# Patient Record
Sex: Female | Born: 1985 | Race: Black or African American | Hispanic: No | Marital: Single | Smoking: Never smoker
Health system: Southern US, Community
[De-identification: ages and names within clinical notes are randomized; demographics above are authoritative.]

## PROBLEM LIST (undated history)

## (undated) ENCOUNTER — Inpatient Hospital Stay (HOSPITAL_COMMUNITY): Payer: Self-pay

## (undated) DIAGNOSIS — R519 Headache, unspecified: Secondary | ICD-10-CM

## (undated) DIAGNOSIS — R51 Headache: Secondary | ICD-10-CM

## (undated) HISTORY — PX: WISDOM TOOTH EXTRACTION: SHX21

## (undated) HISTORY — PX: HERNIA REPAIR: SHX51

---

## 2014-06-07 ENCOUNTER — Inpatient Hospital Stay (HOSPITAL_COMMUNITY)
Admission: AD | Admit: 2014-06-07 | Discharge: 2014-06-07 | Discharge status: Against Medical Advice | Payer: Self-pay | Source: Ambulatory Visit | Attending: Obstetrics and Gynecology | Admitting: Obstetrics and Gynecology

## 2014-06-07 NOTE — MAU Note (Signed)
Pt not in lobby, third call 

## 2014-06-07 NOTE — MAU Note (Addendum)
Pt called, not in lobby 

## 2014-06-07 NOTE — MAU Note (Signed)
Pt not in lobby.  

## 2014-06-11 ENCOUNTER — Inpatient Hospital Stay (HOSPITAL_COMMUNITY)
Admission: AD | Admit: 2014-06-11 | Discharge: 2014-06-11 | Disposition: A | Discharge status: Home / Self Care | Payer: Medicaid Other | Source: Ambulatory Visit | Attending: Obstetrics and Gynecology | Admitting: Obstetrics and Gynecology

## 2014-06-11 ENCOUNTER — Encounter (HOSPITAL_COMMUNITY): Payer: Self-pay | Admitting: *Deleted

## 2014-06-11 ENCOUNTER — Inpatient Hospital Stay (HOSPITAL_COMMUNITY): Payer: Medicaid Other

## 2014-06-11 DIAGNOSIS — R52 Pain, unspecified: Secondary | ICD-10-CM

## 2014-06-11 DIAGNOSIS — O26899 Other specified pregnancy related conditions, unspecified trimester: Secondary | ICD-10-CM

## 2014-06-11 DIAGNOSIS — O26891 Other specified pregnancy related conditions, first trimester: Secondary | ICD-10-CM | POA: Diagnosis not present

## 2014-06-11 DIAGNOSIS — R109 Unspecified abdominal pain: Secondary | ICD-10-CM

## 2014-06-11 DIAGNOSIS — R58 Hemorrhage, not elsewhere classified: Secondary | ICD-10-CM

## 2014-06-11 DIAGNOSIS — R102 Pelvic and perineal pain: Secondary | ICD-10-CM | POA: Diagnosis not present

## 2014-06-11 DIAGNOSIS — Z3A09 9 weeks gestation of pregnancy: Secondary | ICD-10-CM | POA: Diagnosis not present

## 2014-06-11 DIAGNOSIS — Z3491 Encounter for supervision of normal pregnancy, unspecified, first trimester: Secondary | ICD-10-CM

## 2014-06-11 HISTORY — DX: Headache, unspecified: R51.9

## 2014-06-11 HISTORY — DX: Headache: R51

## 2014-06-11 LAB — HCG, QUANTITATIVE, PREGNANCY: HCG, BETA CHAIN, QUANT, S: 26125 m[IU]/mL — AB (ref <5)

## 2014-06-11 LAB — CBC
HCT: 37.8 % (ref 36.0–46.0)
Hemoglobin: 13.1 g/dL (ref 12.0–15.0)
MCH: 30.3 pg (ref 26.0–34.0)
MCHC: 34.7 g/dL (ref 30.0–36.0)
MCV: 87.5 fL (ref 78.0–100.0)
PLATELETS: 129 10*3/uL — AB (ref 150–400)
RBC: 4.32 MIL/uL (ref 3.87–5.11)
RDW: 12.5 % (ref 11.5–15.5)
WBC: 3.9 10*3/uL — AB (ref 4.0–10.5)

## 2014-06-11 LAB — URINALYSIS, ROUTINE W REFLEX MICROSCOPIC
BILIRUBIN URINE: NEGATIVE
Glucose, UA: NEGATIVE mg/dL
Hgb urine dipstick: NEGATIVE
Ketones, ur: NEGATIVE mg/dL
Leukocytes, UA: NEGATIVE
Nitrite: NEGATIVE
PROTEIN: NEGATIVE mg/dL
Specific Gravity, Urine: 1.02 (ref 1.005–1.030)
UROBILINOGEN UA: 0.2 mg/dL (ref 0.0–1.0)
pH: 7 (ref 5.0–8.0)

## 2014-06-11 LAB — WET PREP, GENITAL
CLUE CELLS WET PREP: NONE SEEN
Trich, Wet Prep: NONE SEEN
Yeast Wet Prep HPF POC: NONE SEEN

## 2014-06-11 LAB — ABO/RH: ABO/RH(D): B POS

## 2014-06-11 LAB — POCT PREGNANCY, URINE: Preg Test, Ur: POSITIVE — AB

## 2014-06-11 LAB — HIV ANTIBODY (ROUTINE TESTING W REFLEX): HIV: NONREACTIVE

## 2014-06-11 NOTE — MAU Note (Addendum)
Donating plasma for past month. Wants to make sure everything is ok. Wants U/S to see how far along she is. [facility].

## 2014-06-11 NOTE — MAU Provider Note (Signed)
History     CSN: [id number]  Arrival date and time: 06/11/14 0730   First Provider Initiated Contact with Patient 06/11/14 [phone]      Chief Complaint  Patient presents with  . Possible Pregnancy   HPI Comments: Chloe Armstrong 28 y.o. G2P1001 [redacted]w[redacted]d presents to MAU with two separate episodes of pain and one episode of bleeding. Her first episode of pain was 3 days in Sept 18-20th and she experienced bleeding to the point she thought she was having a miscarriage. Then this month on Oct 17 th and 18th she experienced severe pain again and it was so intense she was crying and would call the level"10". She is having no pain or bleeding now. She is long term patient of Dr Henderson Cloud.   Possible Pregnancy      Past Medical History  Diagnosis Date  . Headache     Chronic migraines    Past Surgical History  Procedure Laterality Date  . Wisdom tooth extraction      History reviewed. No pertinent family history.  History  Substance Use Topics  . Smoking status: Never Smoker   . Smokeless tobacco: Never Used  . Alcohol Use: No    Allergies: No Known Allergies  Prescriptions prior to admission  Medication Sig Dispense Refill  . Prenatal Vit-Fe Fumarate-FA (PRENATAL MULTIVITAMIN) TABS tablet Take 1 tablet by mouth daily at 12 noon.        Review of Systems  Constitutional: Negative.   HENT: Negative.   Eyes: Negative.   Respiratory: Negative.   Cardiovascular: Negative.   Gastrointestinal: Negative.   Genitourinary: Negative.   Musculoskeletal: Negative.   Skin: Negative.   Neurological: Negative.   Endo/Heme/Allergies: Negative.   Psychiatric/Behavioral: Negative.    Physical Exam   Blood pressure 119/73, pulse 93, temperature 98.6 F (37 C), temperature source Oral, resp. rate 18, height 5\' 7"  (1.702 m), weight 100.88 kg (222 lb 6.4 oz), last menstrual period 04/06/2014.  Physical Exam  Constitutional: She is oriented to person, place, and time. She appears  well-developed and well-nourished. No distress.  HENT:  Head: Normocephalic.  GI: Soft. Bowel sounds are normal. She exhibits no distension. There is no tenderness. There is no rebound.  Genitourinary:  Genital:external negative Vaginal:small amount white discharge Cervix:closed/ thick Bimanual:nontender   Musculoskeletal: Normal range of motion.  Neurological: She is alert and oriented to person, place, and time.  Skin: Skin is warm and dry.  Psychiatric: She has a normal mood and affect. Her behavior is normal. Judgment and thought content normal.   Results for orders placed during the hospital encounter of 06/11/14 (from the past 24 hour(s))  URINALYSIS, ROUTINE W REFLEX MICROSCOPIC     Status: None   Collection Time    06/11/14  7:30 AM      Result Value Ref Range   Color, Urine YELLOW  YELLOW   APPearance CLEAR  CLEAR   Specific Gravity, Urine 1.020  1.005 - 1.030   pH 7.0  5.0 - 8.0   Glucose, UA NEGATIVE  NEGATIVE mg/dL   Hgb urine dipstick NEGATIVE  NEGATIVE   Bilirubin Urine NEGATIVE  NEGATIVE   Ketones, ur NEGATIVE  NEGATIVE mg/dL   Protein, ur NEGATIVE  NEGATIVE mg/dL   Urobilinogen, UA 0.2  0.0 - 1.0 mg/dL   Nitrite NEGATIVE  NEGATIVE   Leukocytes, UA NEGATIVE  NEGATIVE  POCT PREGNANCY, URINE     Status: Abnormal   Collection Time    06/11/14  7:46  AM      Result Value Ref Range   Preg Test, Ur POSITIVE (*) NEGATIVE  WET PREP, GENITAL     Status: Abnormal   Collection Time    06/11/14  8:23 AM      Result Value Ref Range   Yeast Wet Prep HPF POC NONE SEEN  NONE SEEN   Trich, Wet Prep NONE SEEN  NONE SEEN   Clue Cells Wet Prep HPF POC NONE SEEN  NONE SEEN   WBC, Wet Prep HPF POC FEW (*) NONE SEEN  HCG, QUANTITATIVE, PREGNANCY     Status: Abnormal   Collection Time    06/11/14  8:58 AM      Result Value Ref Range   hCG, Beta ChainMahalia Armstrong, [place] [address] (*) <5 mIU/mL  CBC     Status: Abnormal   Collection Time    06/11/14  8:59 AM      Result Value Ref Range    WBC 3.9 (*) 4.0 - 10.5 K/uL   RBC 4.32  3.87 - 5.11 MIL/uL   Hemoglobin 13.1  12.0 - 15.0 g/dL   HCT 16.137.8  09.636.0 - 04.546.0 %   MCV 87.5  78.0 - 100.0 fL   MCH 30.3  26.0 - 34.0 pg   MCHC 34.7  30.0 - 36.0 g/dL   RDW 40.912.5  81.111.5 - 91.415.5 %   Platelets 129 (*) 150 - 400 K/uL  ABO/RH     Status: None   Collection Time    06/11/14  8:59 AM      Result Value Ref Range   ABO/RH(D) B POS     [place] Ob Comp Less 14 Wks  06/11/2014   CLINICAL DATA:  28 year old female with acute pain and light bleeding in the first trimester of pregnancy. Initial encounter. Quantitative beta HCG pending. Estimated gestational age by LMP 9 weeks 3 days.  EXAM: OBSTETRIC <14 WK US AND TRANSVAGINAL OB US  TECHNIQUE: Both transabdominal and transvaginal ultrasound examinations were performed for complete evaluation of the gestation as well as the maternal uterus, adnexal regions, and pelvic cul-de-sac. Transvaginal technique was performed to assess early pregnancy.  COMPARISON:  None.  FINDINGS: Intrauterine gestational sac: Single  Yolk sac:  Visible  Embryo:  Visible  Cardiac Activity: Detected  Heart Rate:  115 bpm  CRL:   4.5  mm   6 w 2 d  Maternal uterus/adnexae: Small volume subchorionic hemorrhage (image 17). Probable corpus luteum cyst of the right ovary which measures 3.6 x 2.0 x 2.9 cm. Left ovary not visualized. Small volume simple appearing free fluid about the right ovary.  IMPRESSION: 1.  Single living IUP demonstrated. 2. Small volume subchorionic hemorrhage and simple appearing pelvic free fluid.   Electronically Signed   By: Augusto GambleLee  Armstrong M.D.   On: 06/11/2014 09:55   [place] Ob Transvaginal  06/11/2014   CLINICAL DATA:  28 year old female with acute pain and light bleeding in the first trimester of pregnancy. Initial encounter. Quantitative beta HCG pending. Estimated gestational age by LMP 9 weeks 3 days.  EXAM: OBSTETRIC <14 WK US AND TRANSVAGINAL OB US  TECHNIQUE: Both transabdominal and transvaginal ultrasound  examinations were performed for complete evaluation of the gestation as well as the maternal uterus, adnexal regions, and pelvic cul-de-sac. Transvaginal technique was performed to assess early pregnancy.  COMPARISON:  None.  FINDINGS: Intrauterine gestational sac: Single  Yolk sac:  Visible  Embryo:  Visible  Cardiac Activity: Detected  Heart Rate:  115  bpm  CRL:   4.5  mm   6 w 2 d  Maternal uterus/adnexae: Small volume subchorionic hemorrhage (image 17). Probable corpus luteum cyst of the right ovary which measures 3.6 x 2.0 x 2.9 cm. Left ovary not visualized. Small volume simple appearing free fluid about the right ovary.  IMPRESSION: 1.  Single living IUP demonstrated. 2. Small volume subchorionic hemorrhage and simple appearing pelvic free fluid.   Electronically Signed   By: Augusto GambleLee  Armstrong M.D.   On: 06/11/2014 09:55     MAU Course  Procedures  MDM Wet prep, GC, Chlamydia, CBC, UA, U/S, ABORh, Quant, HIV Reviewed all results with patient Discussed with Dr Tenny Crawoss  Assessment and Plan   A: Pelvic pain on pregnancy IUP  P: Reviewed all results with patients Advised to start PNV and good nutrition Advised to establish prenatal care  Return to MAU as needed    Carolynn ServeBarefoot, Bennie Scaff Miller 06/11/2014, 8:37 AM

## 2014-06-11 NOTE — Discharge Instructions (Signed)

## 2014-06-12 LAB — GC/CHLAMYDIA PROBE AMP
CT Probe RNA: NEGATIVE
GC Probe RNA: NEGATIVE

## 2014-06-15 ENCOUNTER — Encounter (HOSPITAL_COMMUNITY): Payer: Self-pay | Admitting: *Deleted

## 2014-07-21 ENCOUNTER — Other Ambulatory Visit: Payer: Self-pay | Admitting: Obstetrics and Gynecology

## 2014-07-21 LAB — OB RESULTS CONSOLE RUBELLA ANTIBODY, IGM: RUBELLA: IMMUNE

## 2014-07-21 LAB — OB RESULTS CONSOLE RPR: RPR: NONREACTIVE

## 2014-07-21 LAB — OB RESULTS CONSOLE ABO/RH: RH Type: POSITIVE

## 2014-07-21 LAB — OB RESULTS CONSOLE ANTIBODY SCREEN: Antibody Screen: NEGATIVE

## 2014-07-21 LAB — OB RESULTS CONSOLE HIV ANTIBODY (ROUTINE TESTING): HIV: NONREACTIVE

## 2014-07-21 LAB — OB RESULTS CONSOLE GC/CHLAMYDIA
Chlamydia: NEGATIVE
Gonorrhea: NEGATIVE

## 2014-07-21 LAB — OB RESULTS CONSOLE HEPATITIS B SURFACE ANTIGEN: Hepatitis B Surface Ag: NEGATIVE

## 2014-07-22 LAB — CYTOLOGY - PAP

## 2014-08-13 NOTE — L&D Delivery Note (Signed)
Patient was C/C/+4 and pushed for 1 minutes with epidural.   NSVD  female infant, Apgars 8,9, weight P.   The patient had no lacerations. Fundus was firm. EBL was expected amount my estimate <50cc.   Placenta was delivered intact. Vagina was clear.  Baby was vigorous and doing skin to skin with mother.  Undecided about where she wants to have circ done.  Chloe Armstrong

## 2014-08-17 ENCOUNTER — Encounter (HOSPITAL_COMMUNITY): Payer: Self-pay | Admitting: *Deleted

## 2014-08-17 ENCOUNTER — Inpatient Hospital Stay (HOSPITAL_COMMUNITY)
Admission: AD | Admit: 2014-08-17 | Discharge: 2014-08-17 | Disposition: A | Discharge status: Home / Self Care | Payer: Medicaid Other | Source: Ambulatory Visit | Attending: Obstetrics and Gynecology | Admitting: Obstetrics and Gynecology

## 2014-08-17 DIAGNOSIS — R519 Headache, unspecified: Secondary | ICD-10-CM

## 2014-08-17 DIAGNOSIS — O26899 Other specified pregnancy related conditions, unspecified trimester: Secondary | ICD-10-CM

## 2014-08-17 DIAGNOSIS — G43709 Chronic migraine without aura, not intractable, without status migrainosus: Secondary | ICD-10-CM

## 2014-08-17 DIAGNOSIS — M549 Dorsalgia, unspecified: Secondary | ICD-10-CM

## 2014-08-17 DIAGNOSIS — O210 Mild hyperemesis gravidarum: Secondary | ICD-10-CM | POA: Insufficient documentation

## 2014-08-17 DIAGNOSIS — M545 Low back pain: Secondary | ICD-10-CM | POA: Insufficient documentation

## 2014-08-17 DIAGNOSIS — G43909 Migraine, unspecified, not intractable, without status migrainosus: Secondary | ICD-10-CM | POA: Insufficient documentation

## 2014-08-17 DIAGNOSIS — O9989 Other specified diseases and conditions complicating pregnancy, childbirth and the puerperium: Secondary | ICD-10-CM | POA: Diagnosis not present

## 2014-08-17 DIAGNOSIS — Z3A15 15 weeks gestation of pregnancy: Secondary | ICD-10-CM | POA: Diagnosis not present

## 2014-08-17 DIAGNOSIS — O26892 Other specified pregnancy related conditions, second trimester: Secondary | ICD-10-CM

## 2014-08-17 DIAGNOSIS — R51 Headache: Secondary | ICD-10-CM

## 2014-08-17 DIAGNOSIS — O219 Vomiting of pregnancy, unspecified: Secondary | ICD-10-CM

## 2014-08-17 LAB — URINALYSIS, ROUTINE W REFLEX MICROSCOPIC
Bilirubin Urine: NEGATIVE
Glucose, UA: NEGATIVE mg/dL
HGB URINE DIPSTICK: NEGATIVE
KETONES UR: NEGATIVE mg/dL
Leukocytes, UA: NEGATIVE
Nitrite: NEGATIVE
PROTEIN: NEGATIVE mg/dL
Specific Gravity, Urine: 1.015 (ref 1.005–1.030)
Urobilinogen, UA: 0.2 mg/dL (ref 0.0–1.0)
pH: 8.5 — ABNORMAL HIGH (ref 5.0–8.0)

## 2014-08-17 MED ORDER — IBUPROFEN 600 MG PO TABS: 600.0000 mg | ORAL_TABLET | Freq: Four times a day (QID) | ORAL | Status: DC | PRN

## 2014-08-17 MED ORDER — ONDANSETRON HCL 4 MG PO TABS
8.0000 mg | ORAL_TABLET | Freq: Once | ORAL | Status: AC
  Administered 2014-08-17: 8 mg via ORAL
  Filled 2014-08-17: qty 2

## 2014-08-17 MED ORDER — CYCLOBENZAPRINE HCL 10 MG PO TABS: 5.0000 mg | ORAL_TABLET | Freq: Three times a day (TID) | ORAL | Status: DC | PRN

## 2014-08-17 MED ORDER — IBUPROFEN 600 MG PO TABS
600.0000 mg | ORAL_TABLET | Freq: Once | ORAL | Status: AC
  Administered 2014-08-17: 600 mg via ORAL
  Filled 2014-08-17: qty 1

## 2014-08-17 MED ORDER — ONDANSETRON HCL 4 MG PO TABS: 4.0000 mg | ORAL_TABLET | Freq: Every day | ORAL | Status: DC | PRN

## 2014-08-17 MED ORDER — ACETAMINOPHEN 325 MG PO TABS
650.0000 mg | ORAL_TABLET | Freq: Once | ORAL | Status: AC
  Administered 2014-08-17: 650 mg via ORAL
  Filled 2014-08-17: qty 2

## 2014-08-17 NOTE — MAU Note (Signed)
Pt reports lower back pain, lower abd pain off/on for the last week. Also reports a headache today and pain in the back of her right eye.dysuria.

## 2014-08-17 NOTE — MAU Provider Note (Signed)
Chief Complaint: Back Pain; Abdominal Pain; and Headache   First Provider Initiated Contact with Patient 08/17/14 0803     SUBJECTIVE HPI: Chloe Armstrong is a 29 y.o. G2P1001 at 3618w6d by LMP who presents with bil lower back pain 8/10 onset 06/2014. 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 Pt reports pain is dull ache radiating up back. Reports mechanical trip and fall 3 weeks ago at work, denies leg numbness/tingling to incontinence. Last BM today, was "loose but normal" per pt report.Angelique Blonder. Denise home pain meds, denies cp, sob, cough.  Pt also reports Rt side post auricular headache onset 3am today. Pt reports hx of migraines, denies taking medications for it. Pt reports "a little blurry" in rt eye. Pt reports drove without difficulty to MAU today. Pt reports decreased PO fluid and food intake x 1 week due to intermittent nausea and irregular work hours. Pt reports 1 episode of vomiting bile this am, reports able to tolerate juice just prior to arrival. Pt denies nausea at this time.  Pt denies vaginal bleeding or discharge, dysuria, hematuria, fever/chills, cough. Pt denies abd pain at this time. [facility] for routine OB visit.    Past Medical History  Diagnosis Date  . Headache     Chronic migraines   OB History  Gravida Para Term Preterm AB SAB TAB Ectopic Multiple Living  2 1 1       1     # Outcome Date GA Lbr Len/2nd Weight Sex Delivery Anes PTL Lv  2 Current           1 Term      Vag-Spont        Past Surgical History  Procedure Laterality Date  . Wisdom tooth extraction     History   Social History  . Marital Status: Single    Spouse Name: N/A    Number of Children: N/A  . Years of Education: N/A   Occupational History  . Not on file.   Social History Main Topics  . Smoking status: Never Smoker   . Smokeless tobacco: Never Used  . Alcohol Use: No  . Drug Use: No  . Sexual  Activity: Not on file   Other Topics Concern  . Not on file   Social History Narrative   No current facility-administered medications on file prior to encounter.   Current Outpatient Prescriptions on File Prior to Encounter  Medication Sig Dispense Refill  . Prenatal Vit-Fe Fumarate-FA (PRENATAL MULTIVITAMIN) TABS tablet Take 1 tablet by mouth daily at 12 noon.     No Known Allergies  ROS: Pertinent items in HPI  OBJECTIVE Blood pressure 129/72, pulse 87, temperature 99 F (37.2 C), temperature source Oral, resp. rate 16, height 5\' 6"  (1.676 m), weight 105.235 kg (232 lb), last menstrual period 04/06/2014, SpO2 100 %. GENERAL: Well-developed, well-nourished female in no acute distress.  HEENT: Normocephalic HEART: normal rate, S1S2 auscultated RESP: normal effort, bil lung fields clear ABDOMEN: Soft, non-tender, + bowel sounds in all quadrants, no masses or organomegal. EXTREMITIES: Nontender, no edema.  NEURO: Alert and oriented Musculoskeletal: Bil lumbar accessory muscle tightness and pain with palpation. Full spinal ROM flexion, extension and rotation. Distal sensation and circulation intact. FHR 145 by doppler.   LAB RESULTS Results for orders placed or performed during the hospital encounter of 08/17/14 (from the past 24 hour(s))  Urinalysis, Routine w reflex microscopic  Status: Abnormal   Collection Time: 08/17/14  6:45 AM  Result Value Ref Range   Color, Urine YELLOW YELLOW   APPearance CLEAR CLEAR   Specific Gravity, Urine 1.015 1.005 - 1.030   pH 8.5 (H) 5.0 - 8.0   Glucose, UA NEGATIVE NEGATIVE mg/dL   Hgb urine dipstick NEGATIVE NEGATIVE   Bilirubin Urine NEGATIVE NEGATIVE   Ketones, ur NEGATIVE NEGATIVE mg/dL   Protein, ur NEGATIVE NEGATIVE mg/dL   Urobilinogen, UA 0.2 0.0 - 1.0 mg/dL   Nitrite NEGATIVE NEGATIVE   Leukocytes, UA NEGATIVE NEGATIVE    IMAGING No results found.  MAU COURSE Ibuprofen and Zofran for pain and Nausea. Pt reports pain  decreased to 4/10, no difficulty tolerating PO intake. ASSESSMENT 1. Back pain during pregnancy   2. Headache in pregnancy, second trimester   3. Nausea and vomiting during pregnancy prior to [redacted] weeks gestation   4. Chronic migraine without aura without status migrainosus, not intractable    PLAN Discharge home Rx Flexeril 5-10mg  PO Q8H PRN pain, pt educated on side effects, advised not to drive with this medication. Zofran  PO Q8H PRN nausea OTC Ibuprofen PRN HA and back pain, pt educated not to use after 28 weeks. F/U with [facility] at regularly scheduled appt Pt educated on dietary recommendations and eating pattern during pregnancy Work note given.   Follow-up Information    Follow up with Mickel Baas, MD.   Specialty:  Obstetrics and Gynecology   Why:  As Scheduled, or as needed, If symptoms worsen   Contact information:   [address] [place] [address] [phone]        Medication List    TAKE these medications        cyclobenzaprine 10 MG tablet  Commonly known as:  FLEXERIL  Take 0.5-1 tablets (5-10 mg total) by mouth every 8 (eight) hours as needed for muscle spasms.     ibuprofen 600 MG tablet  Commonly known as:  ADVIL,MOTRIN  Take 1 tablet (600 mg total) by mouth every 6 (six) hours as needed for headache or moderate pain. Do not use after [redacted] weeks gestation.     ondansetron 4 MG tablet  Commonly known as:  ZOFRAN  Take 1 tablet (4 mg total) by mouth daily as needed for nausea or vomiting.     prenatal multivitamin Tabs tablet  Take 1 tablet by mouth daily at 12 noon.       Jsean Taussig, FNP-S I was present for the exam and agree with above.  [place], CNM 08/17/2014 9:43 AM

## 2014-08-17 NOTE — MAU Note (Addendum)
PT SAYS SHE STARTED HAVING  ABD PAIN  ON  SAT - BUT  STOPPED - STARTED AGAIN  LAST NIGHT . NOW HURTS LESS.     SAYS BACK PAIN STARTED IN  NOV- HAS CONTINUED   2-3 X /WEEK-   HURTS WORSE  WHEN IN BED AT NIGHT.     THIS MORN AT 0300- SHE WOKE  WITH H/A  AND EYES HURTING -   NO MEDS.   HEAD HURTS LESS NOW.      SAYS HAD LOOSE BM   THIS AM.    VOMITED  AT 0400   -STILL VOMITS EVERYDAY - NO PHENERGAN.   WAS IN OFFICE  ON 12-31.     NEXT APPOINTMENT   IS 09-09-2014.     LAST SEX-   SUNDAY

## 2014-08-17 NOTE — Discharge Instructions (Signed)

## 2014-10-19 ENCOUNTER — Inpatient Hospital Stay (HOSPITAL_COMMUNITY)
Admission: AD | Admit: 2014-10-19 | Discharge: 2014-10-19 | Disposition: A | Discharge status: Home / Self Care | Payer: Medicaid Other | Source: Ambulatory Visit | Attending: Obstetrics and Gynecology | Admitting: Obstetrics and Gynecology

## 2014-10-19 ENCOUNTER — Encounter (HOSPITAL_COMMUNITY): Payer: Self-pay | Admitting: *Deleted

## 2014-10-19 DIAGNOSIS — N949 Unspecified condition associated with female genital organs and menstrual cycle: Secondary | ICD-10-CM

## 2014-10-19 DIAGNOSIS — O9989 Other specified diseases and conditions complicating pregnancy, childbirth and the puerperium: Secondary | ICD-10-CM | POA: Diagnosis not present

## 2014-10-19 DIAGNOSIS — Z3A24 24 weeks gestation of pregnancy: Secondary | ICD-10-CM

## 2014-10-19 DIAGNOSIS — Z3A25 25 weeks gestation of pregnancy: Secondary | ICD-10-CM | POA: Insufficient documentation

## 2014-10-19 DIAGNOSIS — R109 Unspecified abdominal pain: Secondary | ICD-10-CM | POA: Diagnosis present

## 2014-10-19 LAB — URINALYSIS, ROUTINE W REFLEX MICROSCOPIC
Bilirubin Urine: NEGATIVE
Glucose, UA: NEGATIVE mg/dL
HGB URINE DIPSTICK: NEGATIVE
Ketones, ur: NEGATIVE mg/dL
Leukocytes, UA: NEGATIVE
NITRITE: NEGATIVE
Protein, ur: NEGATIVE mg/dL
SPECIFIC GRAVITY, URINE: 1.025 (ref 1.005–1.030)
Urobilinogen, UA: 0.2 mg/dL (ref 0.0–1.0)
pH: 6.5 (ref 5.0–8.0)

## 2014-10-19 NOTE — MAU Note (Signed)
Lower abd/pelvic pain since Sunday, unable to sleep last night.  Vomited this morning, no diarrhea.  Denies bleeding or LOF.

## 2014-10-19 NOTE — MAU Provider Note (Signed)
History     CSN: [id number]  Arrival date and time: 10/19/14 1710   None     Chief Complaint  Patient presents with  . Pelvic Pain   HPI This is a 29 y.o. female at 2444w6d who presents with c/o pelvic pain since Sunday. Pain gets better when she lays down. Has some back pain. Feels it most when she turns side to side in bed. Had one episode of vomiting this morning. No diarrhea or bleeding.  States primary is Dr Henderson CloudHorvath  RN note:  Expand All Collapse All   Pt states she is having pain in pelvic area that started on Sunday. Pt states pain goes away when she lays down and back start hurting. Pt states she feels pain the most when she lays on either side       Lower abd/pelvic pain since Sunday, unable to sleep last night. Vomited this morning, no diarrhea. Denies bleeding or LOF.           OB History    Gravida Para Term Preterm AB TAB SAB Ectopic Multiple Living   2 1 1       1       Past Medical History  Diagnosis Date  . Headache     Chronic migraines    Past Surgical History  Procedure Laterality Date  . Wisdom tooth extraction      History reviewed. No pertinent family history.  History  Substance Use Topics  . Smoking status: Never Smoker   . Smokeless tobacco: Never Used  . Alcohol Use: No    Allergies: No Known Allergies  Prescriptions prior to admission  Medication Sig Dispense Refill Last Dose  . Prenatal Vit-Fe Fumarate-FA (PRENATAL MULTIVITAMIN) TABS tablet Take 1 tablet by mouth daily at 12 noon.   Past Week at Unknown time  . cyclobenzaprine (FLEXERIL) 10 MG tablet Take 0.5-1 tablets (5-10 mg total) by mouth every 8 (eight) hours as needed for muscle spasms. (Patient not taking: Reported on 10/19/2014) 30 tablet 0 Not Taking at Unknown time  . ibuprofen (ADVIL,MOTRIN) 600 MG tablet Take 1 tablet (600 mg total) by mouth every 6 (six) hours as needed for headache or moderate pain. Do not use after [redacted] weeks gestation. (Patient not taking:  Reported on 10/19/2014) 30 tablet 1 Not Taking at Unknown time  . ondansetron (ZOFRAN) 4 MG tablet Take 1 tablet (4 mg total) by mouth daily as needed for nausea or vomiting. (Patient not taking: Reported on 10/19/2014) 30 tablet 1 Not Taking at Unknown time    Review of Systems  Constitutional: Negative for fever, chills and malaise/fatigue.  Gastrointestinal: Positive for vomiting and abdominal pain. Negative for nausea, diarrhea and constipation.  Genitourinary: Negative for dysuria.  Neurological: Negative for dizziness.   Physical Exam   Blood pressure 132/83, pulse 77, temperature 98.3 F (36.8 C), temperature source Oral, resp. rate 20, last menstrual period 04/06/2014.  Physical Exam  Constitutional: She is oriented to person, place, and time. She appears well-developed and well-nourished. No distress.  HENT:  Head: Normocephalic.  Cardiovascular: Normal rate and regular rhythm.  Exam reveals no gallop and no friction rub.   No murmur heard. Respiratory: Effort normal and breath sounds normal.  GI: Soft. She exhibits no distension and no mass. There is tenderness (over round ligaments). There is no rebound and no guarding.  Genitourinary: Vagina normal. No vaginal discharge found.  Dilation: Closed Effacement (%): Thick   Musculoskeletal: Normal range of motion.  Neurological: She  is alert and oriented to person, place, and time.  Skin: Skin is warm and dry.  Psychiatric: She has a normal mood and affect.   FHR reassuring No overt contractions. There is one small area for a few minutes where there looks like irritability cramps  MAU Course  Procedures  MDM Results for orders placed or performed during the hospital encounter of 10/19/14 (from the past 24 hour(s))  Urinalysis, Routine w reflex microscopic     Status: Abnormal   Collection Time: 10/19/14  5:30 PM  Result Value Ref Range   Color, Urine YELLOW YELLOW   APPearance HAZY (A) CLEAR   Specific Gravity, Urine  1.025 1.005 - 1.030   pH 6.5 5.0 - 8.0   Glucose, UA NEGATIVE NEGATIVE mg/dL   Hgb urine dipstick NEGATIVE NEGATIVE   Bilirubin Urine NEGATIVE NEGATIVE   Ketones, ur NEGATIVE NEGATIVE mg/dL   Protein, ur NEGATIVE NEGATIVE mg/dL   Urobilinogen, UA 0.2 0.0 - 1.0 mg/dL   Nitrite NEGATIVE NEGATIVE   Leukocytes, UA NEGATIVE NEGATIVE   FFn collected but not sent Discussed with Dr Claiborne Billings.   Assessment and Plan  A:  SIUP at [redacted]w[redacted]d       Lower abdominal pain, most probably related to round ligament pain      No evidence of preterm labor or cervical change     Normal UA  P:  Discharge home       Discussed comfort measures for ligament pain       PTL precautions reviewed       Followup in office as scheduled  [facility] 10/19/2014, 5:49 PM

## 2014-10-19 NOTE — Discharge Instructions (Signed)

## 2014-10-19 NOTE — MAU Note (Signed)
Pt states she is having pain in pelvic area that started on Sunday. Pt states pain goes away when she lays down and back start hurting. Pt states she feels pain the most when she lays on either side

## 2014-12-18 ENCOUNTER — Encounter (HOSPITAL_COMMUNITY): Payer: Self-pay | Admitting: *Deleted

## 2014-12-18 ENCOUNTER — Emergency Department (HOSPITAL_COMMUNITY)
Admission: EM | Admit: 2014-12-18 | Discharge: 2014-12-18 | Disposition: A | Discharge status: Home / Self Care | Payer: Medicaid Other | Attending: Emergency Medicine | Admitting: Emergency Medicine

## 2014-12-18 DIAGNOSIS — B373 Candidiasis of vulva and vagina: Secondary | ICD-10-CM | POA: Diagnosis not present

## 2014-12-18 DIAGNOSIS — Z8679 Personal history of other diseases of the circulatory system: Secondary | ICD-10-CM | POA: Diagnosis not present

## 2014-12-18 DIAGNOSIS — B3731 Acute candidiasis of vulva and vagina: Secondary | ICD-10-CM

## 2014-12-18 DIAGNOSIS — Z79899 Other long term (current) drug therapy: Secondary | ICD-10-CM | POA: Insufficient documentation

## 2014-12-18 DIAGNOSIS — N898 Other specified noninflammatory disorders of vagina: Secondary | ICD-10-CM | POA: Diagnosis present

## 2014-12-18 MED ORDER — FLUCONAZOLE 100 MG PO TABS
200.0000 mg | ORAL_TABLET | Freq: Once | ORAL | Status: AC
  Administered 2014-12-18: 200 mg via ORAL
  Filled 2014-12-18: qty 2

## 2014-12-18 NOTE — Discharge Instructions (Signed)
Follow-up with Dr. Henderson CloudHorvath at the first part of the week.  [facility] or changes in your condition

## 2014-12-18 NOTE — ED Notes (Signed)
[facility]- requested by RN

## 2014-12-18 NOTE — ED Notes (Signed)
[facility] aware of patient and on the way.

## 2014-12-18 NOTE — Progress Notes (Signed)
Spoke with Dr. Dareen PianoAnderson. Pt is a G2P1 with c/o a white sticky vaginal d/c since wed, 12/15/2014. No vaginal bleeding or leaking of fluid. FHR tracing is reactive. One uc noted on EFM. States he wants the ED MD to see pt.

## 2014-12-18 NOTE — Progress Notes (Signed)
Pt presents to ED with c/o white, sticky vaginal d/c. Says that her vaginal area burns and hurts when she touches it. Denies burning on urination except for once. No bleeding or leaking of fluid. Pt is a G2P1 at 6433 3/[redacted] weeks gestation with an EDC of 02/02/2015. Denies any problems with this pregnancy. She had a vaginal d/c with her 1st baby No d/c noted on her perineum.

## 2014-12-18 NOTE — ED Notes (Addendum)
Per Dr Dareen PianoAnderson, pt is cleared obstetrically. ED MD will treat for yeast infection and pt can f/u with OB office next week PRN. Pt agreeable. 1 ctx on monitor, not felt by pt, baby reactive.

## 2014-12-18 NOTE — ED Notes (Signed)
[facility] OB nurse arrived to room.

## 2014-12-18 NOTE — ED Provider Notes (Signed)
CSN: [id number]     Arrival date & time 12/18/14  1727 History   First MD Initiated Contact with Patient 12/18/14 1755     Chief Complaint  Patient presents with  . Vaginal Discharge     (Consider location/radiation/quality/duration/timing/severity/associated sxs/prior Treatment) HPI Patient presents to the emergency department with vaginal irritation with some vaginal discharge.  The patient states that she feels irritation in her vaginal area, like a burning sensation.  Patient states that she feels like there is a sticky film in the vaginal area.  Patient states that she has not had any increasing lower back pain, a gush of fluid or vaginal bleeding.  The patient states that she has not seen her OB/GYN in the last couple of weeks.  The patient states that nothing seems make her condition better or worse.  The patient denies abdominal pain, nausea, vomiting, weakness, dizziness, headache, blurred vision, cough, fever or syncope.  The patient states she did not take any medications prior to arrival   Past Medical History  Diagnosis Date  . Headache     Chronic migraines   Past Surgical History  Procedure Laterality Date  . Wisdom tooth extraction     History reviewed. No pertinent family history. History  Substance Use Topics  . Smoking status: Never Smoker   . Smokeless tobacco: Never Used  . Alcohol Use: No   OB History    Gravida Para Term Preterm AB TAB SAB Ectopic Multiple Living   2 1 1       1      Review of Systems  All other systems negative except as documented in the HPI. All pertinent positives and negatives as reviewed in the HPI.  Allergies  Review of patient's allergies indicates no known allergies.  Home Medications   Prior to Admission medications   Medication Sig Start Date End Date Taking? Authorizing Provider  prenatal vitamin w/FE, FA (PRENATAL 1 + 1) 27-1 MG TABS tablet Take 1 tablet by mouth at bedtime.   Yes Historical Provider, MD   BP 125/69 mmHg   Pulse 88  Temp(Src) 99 F (37.2 C) (Oral)  Resp 16  SpO2 97%  LMP 04/06/2014 Physical Exam  Constitutional: She is oriented to person, place, and time. She appears well-developed and well-nourished. No distress.  HENT:  Head: Normocephalic and atraumatic.  Mouth/Throat: Oropharynx is clear and moist.  Eyes: Pupils are equal, round, and reactive to light.  Neck: Normal range of motion. Neck supple.  Cardiovascular: Normal rate, regular rhythm and normal heart sounds.  Exam reveals no gallop and no friction rub.   No murmur heard. Pulmonary/Chest: Effort normal and breath sounds normal. No respiratory distress.  Abdominal: Soft. Bowel sounds are normal.  The abdomen is gravid but no pain on palpation  Neurological: She is alert and oriented to person, place, and time. She exhibits normal muscle tone. Coordination normal.  Skin: Skin is warm and dry. No rash noted. No erythema.  Nursing note and vitals reviewed.   ED Course  Procedures (including critical care time) The OB rapid response nurse responded and evaluated the patient, spoke with Dr. Dareen PianoAnderson, who is on-call for her OB/GYN.  He states that he would go ahead and just treat her like a vaginal yeast and have her follow-up in the office on Monday.  The patient did not want a pelvic exam at this time   Charlestine NightChristopher Carsin Randazzo, PA-C 12/18/14 1948  Gwyneth SproutWhitney Plunkett, MD 12/18/14 2250

## 2014-12-18 NOTE — ED Notes (Addendum)
Pt reports vaginal discharge and "tingling" sensation to pelvic area since wed. Denies any vaginal bleeding, denies abd pain or back pain. Pt is [redacted] weeks pregnant.

## 2014-12-30 ENCOUNTER — Inpatient Hospital Stay (HOSPITAL_COMMUNITY): Payer: Medicaid Other

## 2014-12-30 ENCOUNTER — Inpatient Hospital Stay (HOSPITAL_COMMUNITY)
Admission: AD | Admit: 2014-12-30 | Discharge: 2014-12-30 | Disposition: A | Discharge status: Home / Self Care | Payer: Medicaid Other | Source: Ambulatory Visit | Attending: Obstetrics & Gynecology | Admitting: Obstetrics & Gynecology

## 2014-12-30 DIAGNOSIS — R071 Chest pain on breathing: Secondary | ICD-10-CM

## 2014-12-30 DIAGNOSIS — Z3A35 35 weeks gestation of pregnancy: Secondary | ICD-10-CM | POA: Diagnosis not present

## 2014-12-30 DIAGNOSIS — R079 Chest pain, unspecified: Secondary | ICD-10-CM | POA: Diagnosis present

## 2014-12-30 DIAGNOSIS — O99613 Diseases of the digestive system complicating pregnancy, third trimester: Secondary | ICD-10-CM

## 2014-12-30 DIAGNOSIS — R12 Heartburn: Secondary | ICD-10-CM | POA: Insufficient documentation

## 2014-12-30 DIAGNOSIS — O9989 Other specified diseases and conditions complicating pregnancy, childbirth and the puerperium: Secondary | ICD-10-CM | POA: Insufficient documentation

## 2014-12-30 LAB — URINALYSIS, ROUTINE W REFLEX MICROSCOPIC
BILIRUBIN URINE: NEGATIVE
Glucose, UA: NEGATIVE mg/dL
HGB URINE DIPSTICK: NEGATIVE
Ketones, ur: NEGATIVE mg/dL
Leukocytes, UA: NEGATIVE
Nitrite: NEGATIVE
Protein, ur: NEGATIVE mg/dL
Specific Gravity, Urine: 1.02 (ref 1.005–1.030)
UROBILINOGEN UA: 0.2 mg/dL (ref 0.0–1.0)
pH: 6.5 (ref 5.0–8.0)

## 2014-12-30 MED ORDER — GI COCKTAIL ~~LOC~~
30.0000 mL | Freq: Once | ORAL | Status: AC
  Administered 2014-12-30: 30 mL via ORAL
  Filled 2014-12-30: qty 30

## 2014-12-30 MED ORDER — RANITIDINE HCL 300 MG PO TABS: 300.0000 mg | ORAL_TABLET | Freq: Every day | ORAL | Status: DC

## 2014-12-30 NOTE — Progress Notes (Signed)
Patient states hurts when taking a deep breathe denies cough or cold, does "spit" a lot.

## 2014-12-30 NOTE — Progress Notes (Signed)
Patient reports no relief from GI Cocktail.

## 2014-12-30 NOTE — Discharge Instructions (Signed)
Food Choices for Gastroesophageal Reflux Disease When you have gastroesophageal reflux disease (GERD), the foods you eat and your eating habits are very important. Choosing the right foods can help ease your discomfort.  WHAT GUIDELINES DO I NEED TO FOLLOW?   Choose fruits, vegetables, whole grains, and low-fat dairy products.   Choose low-fat meat, fish, and poultry.  Limit fats such as oils, salad dressings, butter, nuts, and avocado.   Keep a food diary. This helps you identify foods that cause symptoms.   Avoid foods that cause symptoms. These may be different for everyone.   Eat small meals often instead of 3 large meals a day.   Eat your meals slowly, in a place where you are relaxed.   Limit fried foods.   Cook foods using methods other than frying.   Avoid drinking alcohol.   Avoid drinking large amounts of liquids with your meals.   Avoid bending over or lying down until 2-3 hours after eating.  WHAT FOODS ARE NOT RECOMMENDED?  These are some foods and drinks that may make your symptoms worse: Vegetables Tomatoes. Tomato juice. Tomato and spaghetti sauce. Chili peppers. Onion and garlic. Horseradish. Fruits Oranges, grapefruit, and lemon (fruit and juice). Meats High-fat meats, fish, and poultry. This includes hot dogs, ribs, ham, sausage, salami, and bacon. Dairy Whole milk and chocolate milk. Sour cream. Cream. Butter. Ice cream. Cream cheese.  Drinks Coffee and tea. Bubbly (carbonated) drinks or energy drinks. Condiments Hot sauce. Barbecue sauce.  Sweets/Desserts Chocolate and cocoa. Donuts. Peppermint and spearmint. Fats and Oils High-fat foods. This includes Jamaica[place] fries and potato chips. Other Vinegar. Strong spices. This includes black pepper, white pepper, red pepper, cayenne, curry powder, cloves, ginger, and chili powder. The items listed above may not be a complete list of foods and drinks to avoid. Contact your dietitian for more  information. Document Released: 01/29/2012 Document Revised: 08/04/2013 Document Reviewed: 06/03/2013 [facility] Patient Information 2015 [place], [place]. This information is not intended to replace advice given to you by your health care provider. Make sure you discuss any questions you have with your health care provider. Heartburn During Pregnancy  Heartburn is a burning sensation in the chest caused by stomach acid backing up into the esophagus. Heartburn is common in pregnancy because a certain hormone (progesterone) is released when a woman is pregnant. The progesterone hormone may relax the valve that separates the esophagus from the stomach. This allows acid to go up into the esophagus, causing heartburn. Heartburn may also happen in pregnancy because the enlarging uterus pushes up on the stomach, which pushes more acid into the esophagus. This is especially true in the later stages of pregnancy. Heartburn problems usually go away after giving birth. CAUSES  Heartburn is caused by stomach acid backing up into the esophagus. During pregnancy, this may result from various things, including:   The progesterone hormone.  Changing hormone levels.  The growing uterus pushing stomach acid upward.  Large meals.  Certain foods and drinks.  Exercise.  Increased acid production. SIGNS AND SYMPTOMS   Burning pain in the chest or lower throat.  Bitter taste in the mouth.  Coughing. DIAGNOSIS  Your health care provider will typically diagnose heartburn by taking a careful history of your concern. Blood tests may be done to check for a certain type of bacteria that is associated with heartburn. Sometimes, heartburn is diagnosed by prescribing a heartburn medicine to see if the symptoms improve. In some cases, a procedure called an endoscopy may  be done. In this procedure, a tube with a light and a camera on the end (endoscope) is used to examine the esophagus and the stomach. TREATMENT    Treatment will vary depending on the severity of your symptoms. Your health care provider may recommend:  Over-the-counter medicines (antacids, acid reducers) for mild heartburn.  Prescription medicines to decrease stomach acid or to protect your stomach lining.  Certain changes in your diet.  Elevating the head of your bed by putting blocks under the legs. This helps prevent stomach acid from backing up into the esophagus when you are lying down. HOME CARE INSTRUCTIONS   Only take over-the-counter or prescription medicines as directed by your health care provider.  Raise the head of your bed by putting blocks under the legs if instructed to do so by your health care provider. Sleeping with more pillows is not effective because it only changes the position of your head.  Do not exercise right after eating.  Avoid eating 2-3 hours before bed. Do not lie down right after eating.  Eat small meals throughout the day instead of three large meals.  Identify foods and beverages that make your symptoms worse and avoid them. Foods you may want to avoid include:  Peppers.  Chocolate.  High-fat foods, including fried foods.  Spicy foods.  Garlic and onions.  Citrus fruits, including oranges, grapefruit, lemons, and limes.  Food containing tomatoes or tomato products.  Mint.  Carbonated and caffeinated drinks.  Vinegar. SEEK MEDICAL CARE IF:  You have abdominal pain of any kind.  You feel burning in your upper abdomen or chest, especially after eating or lying down.  You have nausea and vomiting.  Your stomach feels upset after you eat. SEEK IMMEDIATE MEDICAL CARE IF:   You have severe chest pain that goes down your arm or into your jaw or neck.  You feel sweaty, dizzy, or light-headed.  You become short of breath.  You vomit blood.  You have difficulty or pain with swallowing.  You have bloody or black, tarry stools.  You have episodes of heartburn more than 3  times a week, for more than 2 weeks. MAKE SURE YOU:  Understand these instructions.  Will watch your condition.  Will get help right away if you are not doing well or get worse. Document Released: 07/27/2000 Document Revised: 08/04/2013 Document Reviewed: 03/18/2013 [facility]ExitCare Patient Information 2015 [place]Care, [place]. This information is not intended to replace advice given to you by your health care provider. Make sure you discuss any questions you have with your health care provider.

## 2014-12-30 NOTE — Progress Notes (Signed)
Patient refused lab work notified The PNC FinancialLori Clemmons CNM

## 2014-12-30 NOTE — MAU Note (Signed)
Onset of upper chest discomfort since yesterday, denies N/V, has had diarrhea for 3 days, has history of acid reflux.

## 2014-12-30 NOTE — MAU Provider Note (Signed)
History     CSN: [id number]  Arrival date and time: 12/30/14 1236   None     Chief Complaint  Patient presents with  . Chest Pain  . Diarrhea   Chest Pain  This is a new problem. The current episode started yesterday. The onset quality is sudden. The problem occurs constantly. The problem has been unchanged. The pain is at a severity of 4/10. The pain is mild. The quality of the pain is described as tightness. The pain does not radiate. Associated symptoms include exertional chest pressure. Associated symptoms comments: Reflux and spitting. The pain is aggravated by deep breathing and walking. She has tried nothing for the symptoms. Risk factors: pregnancy. Prior diagnostic workup includes chest x-ray and echocardiogram.  Diarrhea     29 y.o. G2P1001 @[redacted]w[redacted]d  presents to the MAU after calling hr doctors office and reporting pain that starts from her throat to mid sternal area that started yesterday.      Past Medical History  Diagnosis Date  . Headache     Chronic migraines    Past Surgical History  Procedure Laterality Date  . Wisdom tooth extraction      No family history on file.  History  Substance Use Topics  . Smoking status: Never Smoker   . Smokeless tobacco: Never Used  . Alcohol Use: No    Allergies: No Known Allergies  Prescriptions prior to admission  Medication Sig Dispense Refill Last Dose  . prenatal vitamin w/FE, FA (PRENATAL 1 + 1) 27-1 MG TABS tablet Take 1 tablet by mouth at bedtime.   12/16/2014    Review of Systems  Constitutional: Negative.   Eyes: Negative.   Respiratory:       Spitting Pain on inspiration   Cardiovascular: Positive for chest pain.  Gastrointestinal: Positive for heartburn and diarrhea.  Genitourinary: Negative.   Musculoskeletal: Positive for neck pain.  Skin: Negative.   Neurological: Negative.   Endo/Heme/Allergies: Negative.   Psychiatric/Behavioral: Negative.    Physical Exam   Blood pressure 142/82,  pulse 98, temperature 98.5 F (36.9 C), temperature source Oral, resp. rate 16, last menstrual period 04/06/2014, SpO2 98 %.  Physical Exam  Nursing note and vitals reviewed. Constitutional: She is oriented to person, place, and time. She appears well-developed and well-nourished. No distress.  HENT:  Mouth/Throat: Mucous membranes are normal. Mucous membranes are not pale. Normal dentition. No uvula swelling. No oropharyngeal exudate, posterior oropharyngeal edema, posterior oropharyngeal erythema or tonsillar abscesses.  Neck: Normal range of motion. Thyromegaly present.  Cardiovascular: Normal rate and regular rhythm.   Respiratory: Effort normal and breath sounds normal. No accessory muscle usage. No respiratory distress.  GI: Soft.  Musculoskeletal: Normal range of motion. She exhibits no edema or tenderness.  Neurological: She is alert and oriented to person, place, and time.  Skin: Skin is warm and dry.  Psychiatric: She has a normal mood and affect. Her behavior is normal. Judgment and thought content normal.  EKG: normal EKG, normal sinus rhythm, normal sinus rhythm. Dg Chest 2 View  12/30/2014   CLINICAL DATA:  29 year old female with a history of dyspnea.  EXAM: CHEST - 2 VIEW  COMPARISON:  None.  FINDINGS: Cardiomediastinal silhouette projects within normal limits in size and contour. No confluent airspace disease, pneumothorax, or pleural effusion.  No displaced fracture.  Unremarkable appearance of the upper abdomen.  IMPRESSION: No radiographic evidence of acute cardiopulmonary disease.  Signed,  Yvone NeuJaime S. Loreta AveWagner, DO  [facility]  [facility]   Electronically Signed   By: Gilmer MorJaime  Wagner D.O.   On: 12/30/2014 13:46    Fetal Eval  Fhr reactive. MAU Course  Procedures  MDM Probable reflux ; pt not stating any relief from GI Cocktail. EKG and Chest xray negative. Pt refused CBC. Fhr Reactive . Will notify Dr Mora ApplPinn of pt  results.Pregnancy unaffected by current symptoms. Pregnancy doing well.  Assessment and Plan  Heartburn  Discharge to home with RX for Zantac Keep next regular scheduled appt.  Clemmons,Lori Grissett 12/30/2014, 1:10 PM

## 2015-01-04 LAB — OB RESULTS CONSOLE GBS: STREP GROUP B AG: POSITIVE

## 2015-01-26 ENCOUNTER — Telehealth (HOSPITAL_COMMUNITY): Payer: Self-pay | Admitting: *Deleted

## 2015-01-26 ENCOUNTER — Encounter (HOSPITAL_COMMUNITY): Payer: Self-pay | Admitting: *Deleted

## 2015-01-26 NOTE — Telephone Encounter (Signed)
Preadmission screen  

## 2015-01-27 ENCOUNTER — Inpatient Hospital Stay (HOSPITAL_COMMUNITY): Payer: Medicaid Other | Admitting: Anesthesiology

## 2015-01-27 ENCOUNTER — Encounter (HOSPITAL_COMMUNITY): Payer: Self-pay

## 2015-01-27 ENCOUNTER — Inpatient Hospital Stay (HOSPITAL_COMMUNITY)
Admission: RE | Admit: 2015-01-27 | Discharge: 2015-01-28 | DRG: 775 | Disposition: A | Discharge status: Home / Self Care | Payer: Medicaid Other | Source: Ambulatory Visit | Attending: Obstetrics and Gynecology | Admitting: Obstetrics and Gynecology

## 2015-01-27 DIAGNOSIS — O99214 Obesity complicating childbirth: Secondary | ICD-10-CM | POA: Diagnosis present

## 2015-01-27 DIAGNOSIS — O99824 Streptococcus B carrier state complicating childbirth: Principal | ICD-10-CM | POA: Diagnosis present

## 2015-01-27 DIAGNOSIS — Z6841 Body Mass Index (BMI) 40.0 and over, adult: Secondary | ICD-10-CM | POA: Diagnosis not present

## 2015-01-27 DIAGNOSIS — Z3A39 39 weeks gestation of pregnancy: Secondary | ICD-10-CM | POA: Diagnosis present

## 2015-01-27 DIAGNOSIS — O9989 Other specified diseases and conditions complicating pregnancy, childbirth and the puerperium: Secondary | ICD-10-CM | POA: Diagnosis present

## 2015-01-27 LAB — CBC
HEMATOCRIT: 34.2 % — AB (ref 36.0–46.0)
HEMOGLOBIN: 11.9 g/dL — AB (ref 12.0–15.0)
MCH: 31.1 pg (ref 26.0–34.0)
MCHC: 34.8 g/dL (ref 30.0–36.0)
MCV: 89.3 fL (ref 78.0–100.0)
Platelets: 174 10*3/uL (ref 150–400)
RBC: 3.83 MIL/uL — ABNORMAL LOW (ref 3.87–5.11)
RDW: 14.8 % (ref 11.5–15.5)
WBC: 5 10*3/uL (ref 4.0–10.5)

## 2015-01-27 LAB — TYPE AND SCREEN
ABO/RH(D): B POS
Antibody Screen: NEGATIVE

## 2015-01-27 LAB — RPR: RPR Ser Ql: NONREACTIVE

## 2015-01-27 MED ORDER — DIPHENHYDRAMINE HCL 25 MG PO CAPS: 25.0000 mg | ORAL_CAPSULE | Freq: Four times a day (QID) | ORAL | Status: DC | PRN

## 2015-01-27 MED ORDER — SODIUM CHLORIDE 0.9 % IJ SOLN: 3.0000 mL | INTRAMUSCULAR | Status: DC | PRN

## 2015-01-27 MED ORDER — SENNOSIDES-DOCUSATE SODIUM 8.6-50 MG PO TABS
2.0000 | ORAL_TABLET | ORAL | Status: DC
  Administered 2015-01-27: 2 via ORAL
  Filled 2015-01-27: qty 2

## 2015-01-27 MED ORDER — OXYTOCIN 40 UNITS IN LACTATED RINGERS INFUSION - SIMPLE MED
1.0000 m[IU]/min | INTRAVENOUS | Status: DC
  Administered 2015-01-27: 2 m[IU]/min via INTRAVENOUS
  Filled 2015-01-27: qty 1000

## 2015-01-27 MED ORDER — FERROUS SULFATE 325 (65 FE) MG PO TABS
325.0000 mg | ORAL_TABLET | Freq: Two times a day (BID) | ORAL | Status: DC
  Administered 2015-01-28 (×2): 325 mg via ORAL
  Filled 2015-01-27 (×2): qty 1

## 2015-01-27 MED ORDER — IBUPROFEN 800 MG PO TABS
800.0000 mg | ORAL_TABLET | Freq: Three times a day (TID) | ORAL | Status: DC
  Administered 2015-01-27 – 2015-01-28 (×3): 800 mg via ORAL
  Filled 2015-01-27 (×3): qty 1

## 2015-01-27 MED ORDER — LIDOCAINE HCL (PF) 1 % IJ SOLN
INTRAMUSCULAR | Status: DC | PRN
  Administered 2015-01-27 (×2): 4 mL

## 2015-01-27 MED ORDER — METHYLERGONOVINE MALEATE 0.2 MG/ML IJ SOLN: 0.2000 mg | INTRAMUSCULAR | Status: DC | PRN

## 2015-01-27 MED ORDER — ZOLPIDEM TARTRATE 5 MG PO TABS: 5.0000 mg | ORAL_TABLET | Freq: Every evening | ORAL | Status: DC | PRN

## 2015-01-27 MED ORDER — DIBUCAINE 1 % RE OINT: 1.0000 "application " | TOPICAL_OINTMENT | RECTAL | Status: DC | PRN

## 2015-01-27 MED ORDER — ONDANSETRON HCL 4 MG/2ML IJ SOLN
4.0000 mg | Freq: Four times a day (QID) | INTRAMUSCULAR | Status: DC | PRN
  Administered 2015-01-27: 4 mg via INTRAVENOUS
  Filled 2015-01-27: qty 2

## 2015-01-27 MED ORDER — ACETAMINOPHEN 325 MG PO TABS: 650.0000 mg | ORAL_TABLET | ORAL | Status: DC | PRN

## 2015-01-27 MED ORDER — PHENYLEPHRINE 40 MCG/ML (10ML) SYRINGE FOR IV PUSH (FOR BLOOD PRESSURE SUPPORT)
80.0000 ug | PREFILLED_SYRINGE | INTRAVENOUS | Status: DC | PRN
  Filled 2015-01-27: qty 2
  Filled 2015-01-27: qty 20

## 2015-01-27 MED ORDER — LACTATED RINGERS IV SOLN: 500.0000 mL | INTRAVENOUS | Status: DC | PRN

## 2015-01-27 MED ORDER — LANOLIN HYDROUS EX OINT
TOPICAL_OINTMENT | CUTANEOUS | Status: DC | PRN
Start: 2015-01-27 — End: 2015-01-28

## 2015-01-27 MED ORDER — BENZOCAINE-MENTHOL 20-0.5 % EX AERO: 1.0000 "application " | INHALATION_SPRAY | CUTANEOUS | Status: DC | PRN

## 2015-01-27 MED ORDER — OXYCODONE-ACETAMINOPHEN 5-325 MG PO TABS: 2.0000 | ORAL_TABLET | ORAL | Status: DC | PRN

## 2015-01-27 MED ORDER — SODIUM CHLORIDE 0.9 % IJ SOLN: 3.0000 mL | Freq: Two times a day (BID) | INTRAMUSCULAR | Status: DC

## 2015-01-27 MED ORDER — OXYTOCIN 40 UNITS IN LACTATED RINGERS INFUSION - SIMPLE MED: 62.5000 mL/h | INTRAVENOUS | Status: DC

## 2015-01-27 MED ORDER — TETANUS-DIPHTH-ACELL PERTUSSIS 5-2.5-18.5 LF-MCG/0.5 IM SUSP: 0.5000 mL | Freq: Once | INTRAMUSCULAR | Status: DC

## 2015-01-27 MED ORDER — SODIUM CHLORIDE 0.9 % IV SOLN: 250.0000 mL | INTRAVENOUS | Status: DC | PRN

## 2015-01-27 MED ORDER — MEASLES, MUMPS & RUBELLA VAC ~~LOC~~ INJ: 0.5000 mL | INJECTION | Freq: Once | SUBCUTANEOUS | Status: DC

## 2015-01-27 MED ORDER — ONDANSETRON HCL 4 MG PO TABS: 4.0000 mg | ORAL_TABLET | ORAL | Status: DC | PRN

## 2015-01-27 MED ORDER — CITRIC ACID-SODIUM CITRATE 334-500 MG/5ML PO SOLN: 30.0000 mL | ORAL | Status: DC | PRN

## 2015-01-27 MED ORDER — FENTANYL 2.5 MCG/ML BUPIVACAINE 1/10 % EPIDURAL INFUSION (WH - ANES)
14.0000 mL/h | INTRAMUSCULAR | Status: DC | PRN
  Administered 2015-01-27 (×2): 14 mL/h via EPIDURAL
  Filled 2015-01-27 (×2): qty 125

## 2015-01-27 MED ORDER — PENICILLIN G POTASSIUM 5000000 UNITS IJ SOLR
5.0000 10*6.[IU] | Freq: Once | INTRAVENOUS | Status: AC
  Administered 2015-01-27: 5 10*6.[IU] via INTRAVENOUS
  Filled 2015-01-27: qty 5

## 2015-01-27 MED ORDER — ONDANSETRON HCL 4 MG/2ML IJ SOLN: 4.0000 mg | INTRAMUSCULAR | Status: DC | PRN

## 2015-01-27 MED ORDER — FAMOTIDINE 20 MG PO TABS
20.0000 mg | ORAL_TABLET | Freq: Two times a day (BID) | ORAL | Status: DC
  Administered 2015-01-28: 20 mg via ORAL
  Filled 2015-01-27 (×3): qty 1

## 2015-01-27 MED ORDER — OXYCODONE-ACETAMINOPHEN 5-325 MG PO TABS: 1.0000 | ORAL_TABLET | ORAL | Status: DC | PRN

## 2015-01-27 MED ORDER — LIDOCAINE HCL (PF) 1 % IJ SOLN
30.0000 mL | INTRAMUSCULAR | Status: DC | PRN
  Filled 2015-01-27: qty 30

## 2015-01-27 MED ORDER — PENICILLIN G POTASSIUM 5000000 UNITS IJ SOLR
2.5000 10*6.[IU] | INTRAMUSCULAR | Status: DC
  Administered 2015-01-27: 2.5 10*6.[IU] via INTRAVENOUS
  Filled 2015-01-27 (×5): qty 2.5

## 2015-01-27 MED ORDER — EPHEDRINE 5 MG/ML INJ
10.0000 mg | INTRAVENOUS | Status: DC | PRN
  Filled 2015-01-27: qty 2

## 2015-01-27 MED ORDER — SIMETHICONE 80 MG PO CHEW: 80.0000 mg | CHEWABLE_TABLET | ORAL | Status: DC | PRN

## 2015-01-27 MED ORDER — WITCH HAZEL-GLYCERIN EX PADS: 1.0000 "application " | MEDICATED_PAD | CUTANEOUS | Status: DC | PRN

## 2015-01-27 MED ORDER — TERBUTALINE SULFATE 1 MG/ML IJ SOLN
0.2500 mg | Freq: Once | INTRAMUSCULAR | Status: DC | PRN
  Filled 2015-01-27: qty 1

## 2015-01-27 MED ORDER — LACTATED RINGERS IV SOLN
INTRAVENOUS | Status: DC
  Administered 2015-01-27 (×2): via INTRAVENOUS

## 2015-01-27 MED ORDER — MAGNESIUM HYDROXIDE 400 MG/5ML PO SUSP: 30.0000 mL | ORAL | Status: DC | PRN

## 2015-01-27 MED ORDER — DIPHENHYDRAMINE HCL 50 MG/ML IJ SOLN: 12.5000 mg | INTRAMUSCULAR | Status: DC | PRN

## 2015-01-27 MED ORDER — OXYCODONE-ACETAMINOPHEN 5-325 MG PO TABS
1.0000 | ORAL_TABLET | ORAL | Status: DC | PRN
  Administered 2015-01-27: 1 via ORAL
  Filled 2015-01-27: qty 1

## 2015-01-27 MED ORDER — OXYTOCIN BOLUS FROM INFUSION: 500.0000 mL | INTRAVENOUS | Status: DC

## 2015-01-27 MED ORDER — PRENATAL MULTIVITAMIN CH
1.0000 | ORAL_TABLET | Freq: Every day | ORAL | Status: DC
  Administered 2015-01-28: 1 via ORAL
  Filled 2015-01-27: qty 1

## 2015-01-27 MED ORDER — METHYLERGONOVINE MALEATE 0.2 MG PO TABS: 0.2000 mg | ORAL_TABLET | ORAL | Status: DC | PRN

## 2015-01-27 NOTE — Progress Notes (Signed)
Pt c/o pain postpartum in abdomen and lower back.  Normal lochia.    Filed Vitals:   01/27/15 1700 01/27/15 1733 01/27/15 1830 01/27/15 2208  BP: 133/83 124/87 142/83 143/76  Pulse: 66 71 72 71  Temp:  98.2 F (36.8 C) 98.6 F (37 C) 98.2 F (36.8 C)  TempSrc:  Oral Oral Oral  Resp:  18 18 20   Height:      Weight:      SpO2:  100% 100% 100%   Lungs CTA B. Cor RRR Abd soft, ff, nontender Ex no c/c/e  A/P Normal delivery with some increase in pain.  Normal SaO2.  Pt had not voided in 5 hours and now feels better.  Probable pain from distended bladder- bladder sucessfully emptied.  Pt instructed not to wait so long to go to bathroom.

## 2015-01-27 NOTE — H&P (Signed)
29 y.o. [redacted]w[redacted]d  G2P1001 comes in for elective induction at term.  Otherwise has good fetal movement and no bleeding.  Past Medical History  Diagnosis Date  . Headache     Chronic migraines    Past Surgical History  Procedure Laterality Date  . Wisdom tooth extraction      OB History  Gravida Para Term Preterm AB SAB TAB Ectopic Multiple Living  2 1 1       1     # Outcome Date GA Lbr Len/2nd Weight Sex Delivery Anes PTL Lv  2 Current           1 Term 07/10/11 [redacted]w[redacted]d  3.572 kg (7 lb 14 oz) M Vag-Spont EPI N Y      History   Social History  . Marital Status: Single    Spouse Name: N/A  . Number of Children: N/A  . Years of Education: N/A   Occupational History  . Not on file.   Social History Main Topics  . Smoking status: Never Smoker   . Smokeless tobacco: Never Used  . Alcohol Use: No  . Drug Use: No  . Sexual Activity: Not on file   Other Topics Concern  . Not on file   Social History Narrative   Review of patient's allergies indicates no known allergies.    Prenatal Transfer Tool  Maternal Diabetes: No Genetic Screening: Normal Maternal Ultrasounds/Referrals: Normal Fetal Ultrasounds or other Referrals:  None Maternal Substance Abuse:  No Significant Maternal Medications:  None Significant Maternal Lab Results: None  Other PNC: uncomplicated.    Filed Vitals:   01/27/15 0750  BP: 142/84  Pulse: 93  Temp: 98.2 F (36.8 C)     Lungs/Cor:  NAD Abdomen:  soft, gravid Ex:  no cords, erythema SVE:  3/70/-2, AROM clear FHTs:  120, good STV, NST R Toco:  qocc   A/P   Term for induction.  Last [place] was 5# at 36 weeks.  GBS POS- PCN  Davionte Lusby A

## 2015-01-27 NOTE — Anesthesia Preprocedure Evaluation (Signed)
Anesthesia Evaluation  Patient identified by MRN, date of birth, ID band Patient awake    Reviewed: Allergy & Precautions, NPO status , Patient's Chart, lab work & pertinent test results  History of Anesthesia Complications Negative for: history of anesthetic complications  Airway Mallampati: III  TM Distance: >3 FB Neck ROM: Full    Dental no notable dental hx. (+) Dental Advisory Given   Pulmonary neg pulmonary ROS,  breath sounds clear to auscultation  Pulmonary exam normal       Cardiovascular negative cardio ROS Normal cardiovascular examRhythm:Regular Rate:Normal     Neuro/Psych  Headaches, negative psych ROS   GI/Hepatic negative GI ROS, Neg liver ROS,   Endo/Other  Morbid obesity  Renal/GU negative Renal ROS  negative genitourinary   Musculoskeletal negative musculoskeletal ROS (+)   Abdominal   Peds negative pediatric ROS (+)  Hematology negative hematology ROS (+)   Anesthesia Other Findings   Reproductive/Obstetrics (+) Pregnancy                             Anesthesia Physical Anesthesia Plan  ASA: III  Anesthesia Plan: Epidural   Post-op Pain Management:    Induction:   Airway Management Planned:   Additional Equipment:   Intra-op Plan:   Post-operative Plan:   Informed Consent: I have reviewed the patients History and Physical, chart, labs and discussed the procedure including the risks, benefits and alternatives for the proposed anesthesia with the patient or authorized representative who has indicated his/her understanding and acceptance.   Dental advisory given  Plan Discussed with: CRNA  Anesthesia Plan Comments:         Anesthesia Quick Evaluation

## 2015-01-27 NOTE — Progress Notes (Signed)
Patient called to go to the bathroom.  Patient sitting on side of bed refusing to walk to the bathroom.  Helped patient to a standing position and patient was able to walk in place.  Patient continue to refuse to walk and asked for a wheelchair.  FOB went out to get a wheelchair and pushed patient into bathroom.  While in the bathroom patient begin to cry and complain of back and abdomen pain of a 10 and shortness of breathe.  Advised patient to empty bladder.  Talked to patient about emptying bladder every 2 hours and the importance of ambulation.  Vital signs obtained.  Phone call placed to Dr. Henderson Cloud.

## 2015-01-27 NOTE — Anesthesia Procedure Notes (Signed)
Epidural Patient location during procedure: OB  Staffing Anesthesiologist: Ember Gottwald Performed by: anesthesiologist   Preanesthetic Checklist Completed: patient identified, site marked, surgical consent, pre-op evaluation, timeout performed, IV checked, risks and benefits discussed and monitors and equipment checked  Epidural Patient position: sitting Prep: site prepped and draped and DuraPrep Patient monitoring: continuous pulse ox and blood pressure Approach: midline Location: L3-L4 Injection technique: LOR saline  Needle:  Needle type: Tuohy  Needle gauge: 17 G Needle length: 9 cm and 9 Needle insertion depth: 8 cm Catheter type: closed end flexible Catheter size: 19 Gauge Catheter at skin depth: 12 cm Test dose: negative  Assessment Events: blood not aspirated, injection not painful, no injection resistance, negative IV test and no paresthesia  Additional Notes Patient identified. Risks/Benefits/Options discussed with patient including but not limited to bleeding, infection, nerve damage, paralysis, failed block, incomplete pain control, headache, blood pressure changes, nausea, vomiting, reactions to medication both or allergic, itching and postpartum back pain. Confirmed with bedside nurse the patient's most recent platelet count. Confirmed with patient that they are not currently taking any anticoagulation, have any bleeding history or any family history of bleeding disorders. Patient expressed understanding and wished to proceed. All questions were answered. Sterile technique was used throughout the entire procedure. Please see nursing notes for vital signs. Test dose was given through epidural catheter and negative prior to continuing to dose epidural or start infusion. Warning signs of high block given to the patient including shortness of breath, tingling/numbness in hands, complete motor block, or any concerning symptoms with instructions to call for help. Patient was  given instructions on fall risk and not to get out of bed. All questions and concerns addressed with instructions to call with any issues or inadequate analgesia.      

## 2015-01-28 MED ORDER — OXYCODONE-ACETAMINOPHEN 5-325 MG PO TABS: 1.0000 | ORAL_TABLET | ORAL | Status: DC | PRN

## 2015-01-28 MED ORDER — MEDROXYPROGESTERONE ACETATE 150 MG/ML IM SUSP
150.0000 mg | Freq: Once | INTRAMUSCULAR | Status: AC
  Administered 2015-01-28: 150 mg via INTRAMUSCULAR
  Filled 2015-01-28: qty 1

## 2015-01-28 NOTE — Discharge Summary (Signed)
Obstetric Discharge Summary Reason for Admission: induction of labor Prenatal Procedures: ultrasound Intrapartum Procedures: spontaneous vaginal delivery Postpartum Procedures: none Complications-Operative and Postpartum: none HEMOGLOBIN  Date Value Ref Range Status  01/27/2015 11.9* 12.0 - 15.0 g/dL Final   HCT  Date Value Ref Range Status  01/27/2015 34.2* 36.0 - 46.0 % Final    Physical Exam:  General: alert Lochia: appropriate Uterine Fundus: firm   Discharge Diagnoses: Term Pregnancy-delivered  Discharge Information: Date: 01/28/2015 Activity: pelvic rest Diet: routine Medications: PNV, Ibuprofen and Percocet Condition: stable Instructions: refer to practice specific booklet Discharge to: home Follow-up Information    Follow up with HORVATH,MICHELLE A, MD. Schedule an appointment as soon as possible for a visit in 1 month.   Specialty:  Obstetrics and Gynecology   Contact information:   [address] [place] [place] [address] [phone]       Newborn Data: Live born female  Birth Weight: 7 lb 6.2 oz (3350 g) APGAR: 8, 9  Home with mother.  Chloe Armstrong E 01/28/2015, 8:53 AM

## 2015-01-28 NOTE — Anesthesia Postprocedure Evaluation (Signed)
  Anesthesia Post-op Note  Patient: Chloe Armstrong  Procedure(s) Performed: * No procedures listed *  Patient Location: Mother/Baby  Anesthesia Type:Epidural  Level of Consciousness: awake, alert , oriented and patient cooperative  Airway and Oxygen Therapy: Patient Spontanous Breathing  Post-op Pain: none  Post-op Assessment: Post-op Vital signs reviewed, Patient's Cardiovascular Status Stable, Respiratory Function Stable, Patent Airway, No signs of Nausea or vomiting, Adequate PO intake, Pain level controlled, No headache, No backache, Spinal receding and Patient able to bend at knees              Post-op Vital Signs: Reviewed and stable  Last Vitals:  Filed Vitals:   01/28/15 0543  BP: 131/86  Pulse: 68  Temp: 37 C  Resp: 20    Complications: No apparent anesthesia complications

## 2015-01-28 NOTE — Progress Notes (Signed)
PPD#1  Pt without c/o. Voiding well now. VSSAF IMP/ stable Plan/ Will discharge later today

## 2015-04-19 ENCOUNTER — Other Ambulatory Visit: Payer: Self-pay | Admitting: Surgery

## 2015-04-19 NOTE — H&P (Signed)
Chloe Armstrong 04/19/2015 9:26 AM [facility]: [facility] Armstrong #: [id number] DOB: Jan 23, 1986 Married / Language: English / Race: Black or African American Armstrong History of Present Illness Ardeth Sportsman MD; 04/19/2015 9:56 AM) Armstrong words: hernia.  Chloe Armstrong is a 28 year old Armstrong who presents with an umbilical hernia. Armstrong sent for surgical consultation by Chloe Armstrong obstetrician Dr. Carrington Clamp. Diastasis & umbilical hernia after pregnancy. Chloe Armstrong. Chloe Armstrong today with Chloe Armstrong significant other. Delivered Chloe Armstrong child in June. Did notice a belly button hernia during Chloe Armstrong pregnancy and some increasing diastases recti. It is sensitive. Chloe Armstrong was concerned. Discussed with Chloe Armstrong OB/GYN. Surgical consultation recommended by Dr Henderson Cloud. Armstrong's never had any surgery before. Chloe Armstrong does not smoke. Moderately active. Has bowel movement every day. Not breast-feeding at this time. Bottlefeeding. Other Problems Lamar Laundry Bynum, CMA; 04/19/2015 9:26 AM) High blood pressure Migraine Headache Umbilical Hernia Repair  Past Surgical History Gilmer Mor, CMA; 04/19/2015 9:26 AM) Oral Surgery  Diagnostic Studies History Gilmer Mor, CMA; 04/19/2015 9:26 AM) Colonoscopy never Mammogram within last year Pap Smear 1-5 years ago  Allergies Lamar Laundry Bynum, CMA; 04/19/2015 9:28 AM) No Known Drug Allergies 04/19/2015  Medication History (Sonya Bynum, CMA; 04/19/2015 9:28 AM) No Current Medications Medications Reconciled  Social History Gilmer Mor, CMA; 04/19/2015 9:26 AM) Alcohol use Occasional alcohol use. Caffeine use Carbonated beverages, Coffee, Tea. No drug use Tobacco use Never smoker.  Family History Gilmer Mor, CMA; 04/19/2015 9:26 AM) Seizure disorder Father.  Pregnancy / Birth History Gilmer Mor, CMA; 04/19/2015 9:26 AM) Age at menarche 12 years. Gravida 2 Maternal age 57-25 Para 2 Regular periods     Review of Systems Lamar Laundry Bynum CMA; 04/19/2015  9:26 AM) General Not Present- Appetite Loss, Chills, Fatigue, Fever, Night Sweats, Weight Gain and Weight Loss. Skin Not Present- Change in Wart/Mole, Dryness, Hives, Jaundice, New Lesions, Non-Healing Wounds, Rash and Ulcer. HEENT Not Present- Earache, Hearing Loss, Hoarseness, Nose Bleed, Oral Ulcers, Ringing in Chloe Ears, Seasonal Allergies, Sinus Pain, Sore Throat, Visual Disturbances, Wears glasses/contact lenses and Yellow Eyes. Respiratory Not Present- Bloody sputum, Chronic Cough, Difficulty Breathing, Snoring and Wheezing. Breast Not Present- Breast Mass, Breast Pain, Nipple Discharge and Skin Changes. Cardiovascular Not Present- Chest Pain, Difficulty Breathing Lying Down, Leg Cramps, Palpitations, Rapid Heart Rate, Shortness of Breath and Swelling of Extremities. Gastrointestinal Present- Abdominal Pain and Bloody Stool. Not Present- Bloating, Change in Bowel Habits, Chronic diarrhea, Constipation, Difficulty Swallowing, Excessive gas, Gets full quickly at meals, Hemorrhoids, Indigestion, Nausea, Rectal Pain and Vomiting. Armstrong Genitourinary Present- Urgency. Not Present- Frequency, Nocturia, Painful Urination and Pelvic Pain. Musculoskeletal Present- Back Pain. Not Present- Joint Pain, Joint Stiffness, Muscle Pain, Muscle Weakness and Swelling of Extremities. Neurological Present- Headaches. Not Present- Decreased Memory, Fainting, Numbness, Seizures, Tingling, Tremor, Trouble walking and Weakness. Psychiatric Not Present- Anxiety, Bipolar, Change in Sleep Pattern, Depression, Fearful and Frequent crying. Endocrine Not Present- Cold Intolerance, Excessive Hunger, Hair Changes, Heat Intolerance, Hot flashes and New Diabetes. Hematology Not Present- Easy Bruising, Excessive bleeding, Gland problems, HIV and Persistent Infections.  Vitals (Sonya Bynum CMA; 04/19/2015 9:27 AM) 04/19/2015 9:27 AM Weight: 244.6 lb Height: 66in Body Surface Area: 2.27 m Body Mass Index: 39.48 kg/m Temp.:  74F(Temporal)  Pulse: 79 (Regular)  BP: 128/76 (Sitting, Left Arm, Standard)     Physical Exam Ardeth Sportsman MD; 04/19/2015 9:53 AM)  General Mental Status-Alert. General Appearance-Not in acute distress, Not Sickly. Orientation-Oriented X3. Hydration-Well hydrated. Voice-Normal.  Integumentary Global Assessment Upon inspection and palpation of  skin surfaces of Chloe - Axillae: non-tender, no inflammation or ulceration, no drainage. and Distribution of scalp and body hair is normal. General Characteristics Temperature - normal warmth is noted.  Head and Neck Head-normocephalic, atraumatic with no lesions or palpable masses. Face Global Assessment - atraumatic, no absence of expression. Neck Global Assessment - no abnormal movements, no bruit auscultated on Chloe right, no bruit auscultated on Chloe left, no decreased range of motion, non-tender. Trachea-midline. Thyroid Gland Characteristics - non-tender.  Eye Eyeball - Left-Extraocular movements intact, No Nystagmus. Eyeball - Right-Extraocular movements intact, No Nystagmus. Cornea - Left-No Hazy. Cornea - Right-No Hazy. Sclera/Conjunctiva - Left-No scleral icterus, No Discharge. Sclera/Conjunctiva - Right-No scleral icterus, No Discharge. Pupil - Left-Direct reaction to light normal. Pupil - Right-Direct reaction to light normal.  ENMT Ears Pinna - Left - no drainage observed, no generalized tenderness observed. Right - no drainage observed, no generalized tenderness observed. Nose and Sinuses External Inspection of Chloe Nose - no destructive lesion observed. Inspection of Chloe nares - Left - quiet respiration. Right - quiet respiration. Mouth and Throat Lips - Upper Lip - no fissures observed, no pallor noted. Lower Lip - no fissures observed, no pallor noted. Nasopharynx - no discharge present. Oral Cavity/Oropharynx - Tongue - no dryness observed. Oral Mucosa - no cyanosis observed.  Hypopharynx - no evidence of airway distress observed.  Chest and Lung Exam Inspection Movements - Normal and Symmetrical. Accessory muscles - No use of accessory muscles in breathing. Palpation Palpation of Chloe chest reveals - Non-tender. Auscultation Breath sounds - Normal and Clear.  Cardiovascular Auscultation Rhythm - Regular. Murmurs & Other Heart Sounds - Auscultation of Chloe heart reveals - No Murmurs and No Systolic Clicks.  Abdomen Inspection Inspection of Chloe abdomen reveals - No Visible peristalsis and No Abnormal pulsations. Umbilicus - No Bleeding, No Urine drainage. Palpation/Percussion Palpation and Percussion of Chloe abdomen reveal - Soft, Non Tender, No Rebound tenderness, No Rigidity (guarding) and No Cutaneous hyperesthesia. Note: Obese but soft. Extraocular straining consistent with recent pregnancy. Moderate large diastases recti. 3 x 3 cm umbilical hernia. Sensitive but reducible.   Armstrong Genitourinary Sexual Maturity Tanner 5 - Adult hair pattern. Note: No vaginal bleeding nor discharge. No inguinal hernias.   Peripheral Vascular Upper Extremity Inspection - Left - No Cyanotic nailbeds, Not Ischemic. Right - No Cyanotic nailbeds, Not Ischemic.  Neurologic Neurologic evaluation reveals -normal attention span and ability to concentrate, able to name objects and repeat phrases. Appropriate fund of knowledge , normal sensation and normal coordination. Mental Status Affect - not angry, not paranoid. Cranial Nerves-Normal Bilaterally. Gait-Normal.  Neuropsychiatric Mental status exam performed with findings of-able to articulate well with normal speech/language, rate, volume and coherence, thought content normal with ability to perform basic computations and apply abstract reasoning and no evidence of hallucinations, delusions, obsessions or homicidal/suicidal ideation.  Musculoskeletal Global Assessment Spine, Ribs and Pelvis - no instability,  subluxation or laxity. Right Upper Extremity - no instability, subluxation or laxity.  Lymphatic Head & Neck  General Head & Neck Lymphatics: Bilateral - Description - No Localized lymphadenopathy. Axillary  General Axillary Region: Bilateral - Description - No Localized lymphadenopathy. Femoral & Inguinal  Generalized Femoral & Inguinal Lymphatics: Left - Description - No Localized lymphadenopathy. Right - Description - No Localized lymphadenopathy.    Assessment & Plan Ardeth Sportsman MD; 04/19/2015 9:56 AM)  UMBILICAL HERNIA WITHOUT OBSTRUCTION AND WITHOUT GANGRENE (K42.9) Impression: Moderate but sensitive umbilical hernia in a young, obese woman with diastases.  Given Chloe Armstrong young age and activity level and obesity, think Chloe Armstrong would benefit from mesh reinforcement with repair. Laparoscopic underlay repair. Chloe Armstrong is etc. proceeding. Work to schedule convenient time.  Current Plans You are being scheduled for surgery - Our schedulers will call you.  You should hear from our office's scheduling department within 5 working days about Chloe location, date, and time of surgery. We try to make accommodations for Armstrong's preferences in scheduling surgery, but sometimes Chloe OR schedule or Chloe surgeon's schedule prevents [place] from making those accommodations.  If you have not heard from our office [phone]) in 5 working days, call Chloe office and ask for your surgeon's nurse.  If you have other questions about your diagnosis, plan, or surgery, call Chloe office and ask for your surgeon's nurse. Written instructions provided Pt Education - Pamphlet Given - Laparoscopic Hernia Repair: discussed with Armstrong and provided information.   Chloe anatomy & physiology of Chloe abdominal wall was discussed. Chloe pathophysiology of hernias was discussed. Natural history risks without surgery including progeressive enlargement, pain, incarceration, & strangulation was discussed. Contributors to complications such  as smoking, obesity, diabetes, prior surgery, etc were discussed.  I feel Chloe risks of no intervention will lead to serious problems that outweigh Chloe operative risks; therefore, I recommended surgery to reduce and repair Chloe hernia. I explained laparoscopic techniques with possible need for an open approach. I noted Chloe probable use of mesh to patch and/or buttress Chloe hernia repair  Risks such as bleeding, infection, abscess, need for further treatment, heart attack, death, and other risks were discussed. I noted a good likelihood this will help address Chloe problem. Goals of post-operative recovery were discussed as well. Possibility that this will not correct all symptoms was explained. I stressed Chloe importance of low-impact activity, aggressive pain control, avoiding constipation, & not pushing through pain to minimize risk of post-operative chronic pain or injury. Possibility of reherniation especially with smoking, obesity, diabetes, immunosuppression, and other health conditions was discussed. We will work to minimize complications.  An educational handout further explaining Chloe pathology & treatment options was given as well. Questions were answered. Chloe Armstrong expresses understanding & wishes to proceed with surgery. Pt Education - [facility] Pain Control (Areta Terwilliger) Pt Education - [facility] Hernia Post-Op HCI (Tynesha Free): discussed with Armstrong and provided information.  Ardeth Sportsman, M.D., F.A.C.S. Gastrointestinal and [facility]imally Invasive [facility] [address] [place], [place] [address] [phone] Main / Paging

## 2015-05-05 ENCOUNTER — Emergency Department (HOSPITAL_COMMUNITY): Payer: Worker's Compensation

## 2015-05-05 ENCOUNTER — Encounter (HOSPITAL_COMMUNITY): Payer: Self-pay | Admitting: *Deleted

## 2015-05-05 ENCOUNTER — Emergency Department (HOSPITAL_COMMUNITY)
Admission: EM | Admit: 2015-05-05 | Discharge: 2015-05-05 | Disposition: A | Discharge status: Home / Self Care | Payer: Worker's Compensation | Attending: Emergency Medicine | Admitting: Emergency Medicine

## 2015-05-05 DIAGNOSIS — S0990XA Unspecified injury of head, initial encounter: Secondary | ICD-10-CM | POA: Diagnosis not present

## 2015-05-05 DIAGNOSIS — Z3202 Encounter for pregnancy test, result negative: Secondary | ICD-10-CM | POA: Insufficient documentation

## 2015-05-05 DIAGNOSIS — Y9289 Other specified places as the place of occurrence of the external cause: Secondary | ICD-10-CM | POA: Insufficient documentation

## 2015-05-05 DIAGNOSIS — Z8679 Personal history of other diseases of the circulatory system: Secondary | ICD-10-CM | POA: Diagnosis not present

## 2015-05-05 DIAGNOSIS — S3992XA Unspecified injury of lower back, initial encounter: Secondary | ICD-10-CM | POA: Insufficient documentation

## 2015-05-05 DIAGNOSIS — M5441 Lumbago with sciatica, right side: Secondary | ICD-10-CM

## 2015-05-05 DIAGNOSIS — Y998 Other external cause status: Secondary | ICD-10-CM | POA: Diagnosis not present

## 2015-05-05 DIAGNOSIS — W108XXA Fall (on) (from) other stairs and steps, initial encounter: Secondary | ICD-10-CM | POA: Diagnosis not present

## 2015-05-05 DIAGNOSIS — W19XXXA Unspecified fall, initial encounter: Secondary | ICD-10-CM

## 2015-05-05 DIAGNOSIS — Y9389 Activity, other specified: Secondary | ICD-10-CM | POA: Insufficient documentation

## 2015-05-05 LAB — POC URINE PREG, ED: PREG TEST UR: NEGATIVE

## 2015-05-05 MED ORDER — CYCLOBENZAPRINE HCL 10 MG PO TABS: 10.0000 mg | ORAL_TABLET | Freq: Two times a day (BID) | ORAL | Status: DC | PRN

## 2015-05-05 MED ORDER — HYDROCODONE-ACETAMINOPHEN 5-325 MG PO TABS: 2.0000 | ORAL_TABLET | ORAL | Status: DC | PRN

## 2015-05-05 MED ORDER — IBUPROFEN 800 MG PO TABS: 800.0000 mg | ORAL_TABLET | Freq: Three times a day (TID) | ORAL | Status: DC

## 2015-05-05 NOTE — ED Notes (Signed)
Pt removed from LSB by 2 RN's and CNA.  C-collar remains in place.

## 2015-05-05 NOTE — ED Provider Notes (Signed)
CSN: [id number]     Arrival date & time 05/05/15  1026 History   First MD Initiated Contact with Patient 05/05/15 1110     Chief Complaint  Patient presents with  . Fall  . Back Pain  . Hand Pain    right 4th digit     (Consider location/radiation/quality/duration/timing/severity/associated sxs/prior Treatment) HPI   The patient is a 29 year old female with history of chronic migraines, she presents emergency department today after a fall down some stairs where she fell onto her left side and she is currently complaining of low back pain with radiation down her right leg.  She did not strike her head, denies loss of consciousness, and states that "it all happened so fast" she doesn't know what caused her to fall.   Patient was brought into the ER by EMS, she was placed in a c-collar. She has ambulated to and from the bathroom, and states that since she is right to the ER her low back pain has gradually worsened. She feels increased stiffness and with minimal movements of her right leg it exacerbates her low back pain.  She denies any weakness, numbness, tingling, nausea, vomiting, shortness of breath, abdominal pain, chest pain. Patient has a history of headaches which is precipitated by not eating, she states that she is currently having an aura and has not eaten in some time. She is endorsing photosensitivity as well, and is asking to eat.   Past Medical History  Diagnosis Date  . Headache     Chronic migraines   Past Surgical History  Procedure Laterality Date  . Wisdom tooth extraction     History reviewed. No pertinent family history. Social History  Substance Use Topics  . Smoking status: Never Smoker   . Smokeless tobacco: Never Used  . Alcohol Use: No   OB History    Gravida Para Term Preterm AB TAB SAB Ectopic Multiple Living   0 2     Review of Systems  Constitutional: Negative.   Eyes: Positive for photophobia. Negative for pain, redness and visual  disturbance.  Respiratory: Negative.  Negative for shortness of breath.   Cardiovascular: Negative.  Negative for chest pain, palpitations and leg swelling.  Gastrointestinal: Negative.  Negative for abdominal pain and abdominal distention.  Endocrine: Negative.   Genitourinary: Negative.   Musculoskeletal: Positive for back pain, arthralgias and gait problem. Negative for myalgias, joint swelling, neck pain and neck stiffness.  Neurological: Positive for headaches. Negative for dizziness, tremors, seizures, syncope, facial asymmetry, weakness, light-headedness and numbness.  Hematological: Negative.   Psychiatric/Behavioral: Negative.       Allergies  Review of patient's allergies indicates no known allergies.  Home Medications   Prior to Admission medications   Medication Sig Start Date End Date Taking? Authorizing Provider  oxyCODONE-acetaminophen (PERCOCET/ROXICET) 5-325 MG per tablet Take 1 tablet by mouth every 4 (four) hours as needed (for pain scale 4-7). Patient not taking: Reported on 05/05/2015 01/28/15   Levi Aland, MD   BP 127/74 mmHg  Pulse 69  Temp(Src) 98.3 F (36.8 C) (Oral)  Resp 18  SpO2 99% Physical Exam  Constitutional: She is oriented to person, place, and time. She appears well-developed and well-nourished. No distress.  HENT:  Head: Normocephalic and atraumatic.  Nose: Nose normal.  Mouth/Throat: Oropharynx is clear and moist. No oropharyngeal exudate.  Eyes: Conjunctivae and EOM are normal. Pupils are equal, round, and reactive to light. Right eye exhibits  no discharge. Left eye exhibits no discharge. No scleral icterus.  Neck: Normal range of motion. No JVD present. No tracheal deviation present. No thyromegaly present.  Neck was nontender, normal range of motion, supple and no rigidity  Cardiovascular: Normal rate, regular rhythm, normal heart sounds and intact distal pulses.  Exam reveals no gallop and no friction rub.   No murmur  heard. Pulmonary/Chest: Effort normal and breath sounds normal. No respiratory distress. She has no wheezes. She has no rales. She exhibits no tenderness.  Abdominal: Soft. Bowel sounds are normal. She exhibits no distension and no mass. There is no tenderness. There is no rebound and no guarding.  Musculoskeletal: Normal range of motion. She exhibits no edema or tenderness.  The patient and c-collar Tenderness to palpation left paraspinal muscles lumbar spine, normal range of motion of the back, no contusion, no spinal process tenderness, normal range of motion of bilateral hips and knees  Lymphadenopathy:    She has no cervical adenopathy.  Neurological: She is alert and oriented to person, place, and time. She has normal reflexes. No cranial nerve deficit. She exhibits normal muscle tone. Coordination normal.  Skin: Skin is warm and dry. No rash noted. She is not diaphoretic. No erythema. No pallor.  Psychiatric: She has a normal mood and affect. Her behavior is normal. Judgment and thought content normal.  Nursing note and vitals reviewed.   ED Course  Procedures (including critical care time) Labs Review Labs Reviewed  POC URINE PREG, ED    Imaging Review No results found. I have personally reviewed and evaluated these images and lab results as part of my medical decision-making.   EKG Interpretation None      MDM   Final diagnoses:  None    Patient with a fall at work down stairs, reports to the ER with low back and right hip pain Patient had been ambulatory prior to my evaluation was still in c-collar. That was cleared clinically however the pain to bilateral low back and right buttocks and right groin will obtain imaging. Lumbar spine and hip films are negative for acute pathology or fracture, patient is able to move all extremities does not have any neurological deficits. She does have antalgic gait but has been ambulatory on the ER. She has a history of migraines and  began to have a severe headache on the ER. She was able to drink while here and requested discharge home.  She did not bring any worker's comp paperwork.  Patient was discharged home with pain medicine, muscle relaxers and NSAIDs. Return precautions given. Close follow-up with PCP.  Filed Vitals:   05/05/15 1033 05/05/15 1419  BP: 127/74 121/81  Pulse: 69 74  Temp: 98.3 F (36.8 C) 98.4 F (36.9 C)  TempSrc: Oral Oral  Resp: 18 18  SpO2: 99% 99%       Danelle Berry, PA-C 05/13/15 0441  Derwood Kaplan, MD 05/16/15 [phone]

## 2015-05-05 NOTE — Discharge Instructions (Signed)
Back Pain, Adult °Back pain is very common. The pain often gets better over time. The cause of back pain is usually not dangerous. Most people can learn to manage their back pain on their own.  °HOME CARE  °· Stay active. Start with short walks on flat ground if you can. Try to walk farther each day. °· Do not sit, drive, or stand in one place for more than 30 minutes. Do not stay in bed. °· Do not avoid exercise or work. Activity can help your back heal faster. °· Be careful when you bend or lift an object. Bend at your knees, keep the object close to you, and do not twist. °· Sleep on a firm mattress. Lie on your side, and bend your knees. If you lie on your back, put a pillow under your knees. °· Only take medicines as told by your doctor. °· Put ice on the injured area. °¨ Put ice in a plastic bag. °¨ Place a towel between your skin and the bag. °¨ Leave the ice on for 15-20 minutes, 03-04 times a day for the first 2 to 3 days. After that, you can switch between ice and heat packs. °· Ask your doctor about back exercises or massage. °· Avoid feeling anxious or stressed. Find good ways to deal with stress, such as exercise. °GET HELP RIGHT AWAY IF:  °· Your pain does not go away with rest or medicine. °· Your pain does not go away in 1 week. °· You have new problems. °· You do not feel well. °· The pain spreads into your legs. °· You cannot control when you poop (bowel movement) or pee (urinate). °· Your arms or legs feel weak or lose feeling (numbness). °· You feel sick to your stomach (nauseous) or throw up (vomit). °· You have belly (abdominal) pain. °· You feel like you may pass out (faint). °MAKE SURE YOU:  °· Understand these instructions. °· Will watch your condition. °· Will get help right away if you are not doing well or get worse. °Document Released: 01/16/2008 Document Revised: 10/22/2011 Document Reviewed: 12/01/2013 °Exit[employer]® Patient Information ©2015 [employer]. This information is not intended  to replace advice given to you by your health care provider. Make sure you discuss any questions you have with your health care provider. ° °

## 2015-05-05 NOTE — ED Notes (Signed)
Per [facility], pt fell down 2 steps, c/o LBP.  Pt presents on LSB, c-collar and head blocks.

## 2015-05-05 NOTE — ED Notes (Signed)
Patient's family called the patient's workplace and asked if a drug test was needed for the incident/ED visit. The person that the family member talked to reported that the supervisor was not there and they would try and contact that person. Also family member reported that they had already started worker's comp papers at the job.

## 2015-05-05 NOTE — ED Notes (Signed)
Bed: WU98 Expected date:  Expected time:  Means of arrival:  Comments: EMS- 28yo F, fall/low back pain/LSB

## 2015-05-05 NOTE — ED Notes (Signed)
Patient given [place] sandwich, cheese and drink as ordered.

## 2015-08-24 ENCOUNTER — Other Ambulatory Visit: Payer: Self-pay | Admitting: Surgery

## 2015-08-24 NOTE — H&P (Signed)
Chloe Armstrong 08/24/2015 5:11 PM Location: [facility] Patient #: [id number] DOB: 31-Oct-1985 Married / Language: English / Race: Black or African American Female  History of Present Illness Ardeth Sportsman MD; 08/24/2015 5:28 PM) The patient is a 30 year old female who presents with an umbilical hernia. Note for "Umbilical hernia": The patient returns to consider hernia surgery repair. Been a few months.  The patient is a 30 year old female who presents with an umbilical hernia. Patient sent for surgical consultation by her obstetrician Dr. Carrington Clamp. Diastasis & umbilical hernia after pregnancy. Pleasant young female. Her today with her significant other. Delivered her child in June. Did notice a belly button hernia during her pregnancy and some increasing diastases recti. It is sensitive. She was concerned. Discussed with her OB/GYN. Surgical consultation recommended by Dr Henderson Cloud. Patient's never had any surgery before. She does not smoke. Moderately active. Has bowel movement every day. Not breast-feeding at this time. Bottlefeeding.  She comes today with her family. The areas gone more sensitive. She is ready to consider surgery. No episodes of nausea or vomiting. No fevers or chills.   Problem List/Past Medical Ardeth Sportsman, MD; 08/24/2015 5:26 PM) UMBILICAL HERNIA WITHOUT OBSTRUCTION AND WITHOUT GANGRENE (K42.9)  Other Problems Ardeth Sportsman, MD; 08/24/2015 5:26 PM) High blood pressure Migraine Headache Umbilical Hernia Repair  Past Surgical History Ardeth Sportsman, MD; 08/24/2015 5:26 PM) Oral Surgery  Diagnostic Studies History Ardeth Sportsman, MD; 08/24/2015 5:26 PM) Colonoscopy never Mammogram within last year Pap Smear 1-5 years ago  Allergies Ethlyn Gallery, CMA; 08/24/2015 5:14 PM) No Known Drug Allergies 04/19/2015  Medication History (Alisha Spillers, CMA; 08/24/2015 5:14 PM) No Current Medications  Social History  Ardeth Sportsman, MD; 08/24/2015 5:26 PM) Alcohol use Occasional alcohol use. Caffeine use Carbonated beverages, Coffee, Tea. No drug use Tobacco use Never smoker.  Family History Ardeth Sportsman, MD; 08/24/2015 5:26 PM) Seizure disorder Father.  Pregnancy / Birth History Ardeth Sportsman, MD; 08/24/2015 5:26 PM) Age at menarche 12 years. Gravida 2 Maternal age 62-25 Para 2 Regular periods     Review of Systems Ardeth Sportsman, MD; 08/24/2015 5:26 PM) General Not Present- Appetite Loss, Chills, Fatigue, Fever, Night Sweats, Weight Gain and Weight Loss. Skin Not Present- Change in Wart/Mole, Dryness, Hives, Jaundice, New Lesions, Non-Healing Wounds, Rash and Ulcer. HEENT Not Present- Earache, Hearing Loss, Hoarseness, Nose Bleed, Oral Ulcers, Ringing in the Ears, Seasonal Allergies, Sinus Pain, Sore Throat, Visual Disturbances, Wears glasses/contact lenses and Yellow Eyes. Respiratory Not Present- Bloody sputum, Chronic Cough, Difficulty Breathing, Snoring and Wheezing. Breast Not Present- Breast Mass, Breast Pain, Nipple Discharge and Skin Changes. Cardiovascular Not Present- Chest Pain, Difficulty Breathing Lying Down, Leg Cramps, Palpitations, Rapid Heart Rate, Shortness of Breath and Swelling of Extremities. Gastrointestinal Present- Abdominal Pain and Bloody Stool. Not Present- Bloating, Change in Bowel Habits, Chronic diarrhea, Constipation, Difficulty Swallowing, Excessive gas, Gets full quickly at meals, Hemorrhoids, Indigestion, Nausea, Rectal Pain and Vomiting. Female Genitourinary Present- Urgency. Not Present- Frequency, Nocturia, Painful Urination and Pelvic Pain. Musculoskeletal Present- Back Pain. Not Present- Joint Pain, Joint Stiffness, Muscle Pain, Muscle Weakness and Swelling of Extremities. Neurological Present- Headaches. Not Present- Decreased Memory, Fainting, Numbness, Seizures, Tingling, Tremor, Trouble walking and Weakness. Psychiatric Not Present- Anxiety,  Bipolar, Change in Sleep Pattern, Depression, Fearful and Frequent crying. Endocrine Not Present- Cold Intolerance, Excessive Hunger, Hair Changes, Heat Intolerance, Hot flashes and New Diabetes. Hematology Not Present- Easy Bruising, Excessive bleeding, Gland  problems, HIV and Persistent Infections.  Vitals (Alisha Spillers CMA; 08/24/2015 5:13 PM) 08/24/2015 5:11 PM Weight: 247 lb Height: 66in Body Surface Area: 2.19 m Body Mass Index: 39.87 kg/m  Pulse: 90 (Regular)  BP: 110/70 (Sitting, Left Arm, Standard)      Physical Exam Ardeth Sportsman MD; 08/24/2015 5:28 PM)  General Mental Status-Alert. General Appearance-Not in acute distress, Not Sickly. Orientation-Oriented X3. Hydration-Well hydrated. Voice-Normal.  Integumentary Global Assessment Normal Exam - Axillae: non-tender, no inflammation or ulceration, no drainage. and Distribution of scalp and body hair is normal. General Characteristics Temperature - normal warmth is noted.  Head and Neck Head-normocephalic, atraumatic with no lesions or palpable masses. Face Global Assessment - atraumatic, no absence of expression. Neck Global Assessment - no abnormal movements, no bruit auscultated on the right, no bruit auscultated on the left, no decreased range of motion, non-tender. Trachea-midline. Thyroid Gland Characteristics - non-tender.  Eye Eyeball - Left-Extraocular movements intact, No Nystagmus. Eyeball - Right-Extraocular movements intact, No Nystagmus. Cornea - Left-No Hazy. Cornea - Right-No Hazy. Sclera/Conjunctiva - Left-No scleral icterus, No Discharge. Sclera/Conjunctiva - Right-No scleral icterus, No Discharge. Pupil - Left-Direct reaction to light normal. Pupil - Right-Direct reaction to light normal.  ENMT Ears Pinna - Left - no drainage observed, no generalized tenderness observed. Right - no drainage observed, no generalized tenderness observed. Nose and  Sinuses Nose - no destructive lesion observed. Nares - Left - quiet respiration. Right - quiet respiration. Mouth and Throat Lips - Upper Lip - no fissures observed, no pallor noted. Lower Lip - no fissures observed, no pallor noted. Nasopharynx - no discharge present. Oral Cavity/Oropharynx - Tongue - no dryness observed. Oral Mucosa - no cyanosis observed. Hypopharynx - no evidence of airway distress observed.  Chest and Lung Exam Inspection Movements - Normal and Symmetrical. Accessory muscles - No use of accessory muscles in breathing. Palpation Normal exam - Non-tender. Auscultation Breath sounds - Normal and Clear.  Cardiovascular Auscultation Rhythm - Regular. Murmurs & Other Heart Sounds - Normal exam - No Murmurs and No Systolic Clicks.  Abdomen Inspection Normal Exam - No Visible peristalsis and No Abnormal pulsations. Umbilicus - No Bleeding, No Urine drainage. Palpation/Percussion Normal exam - Soft, Non Tender, No Rebound tenderness, No Rigidity (guarding) and No Cutaneous hyperesthesia. Note: Obese but soft. Moderate diastases recti. 3 x 3 cm umbilical hernia. Sensitive but reducible. No guarding. No Murphy sign. No rebound tenderness.  Female Genitourinary Sexual Maturity Tanner 5 - Adult hair pattern. Note: No vaginal bleeding nor discharge. No inguinal hernias.  Peripheral Vascular Upper Extremity Inspection - Left - No Cyanotic nailbeds, Not Ischemic. Right - No Cyanotic nailbeds, Not Ischemic.  Neurologic Neurologic evaluation reveals -normal attention span and ability to concentrate, able to name objects and repeat phrases. Appropriate fund of knowledge , normal sensation and normal coordination. Mental Status Affect - not angry, not paranoid. Cranial Nerves-Normal Bilaterally. Gait-Normal.  Neuropsychiatric Mental status exam performed with findings of-able to articulate well with normal speech/language, rate, volume and coherence, thought  content normal with ability to perform basic computations and apply abstract reasoning and no evidence of hallucinations, delusions, obsessions or homicidal/suicidal ideation.  Musculoskeletal Global Assessment Spine, Ribs and Pelvis - no instability, subluxation or laxity. Right Upper Extremity - no instability, subluxation or laxity.  Lymphatic Head & Neck General Head & Neck Lymphatics: Bilateral - Description - No Localized lymphadenopathy. Axillary General Axillary Region: Bilateral - Description - No Localized lymphadenopathy. Femoral & Inguinal Generalized Femoral & Inguinal Lymphatics:  Left: Right - Description - No Localized lymphadenopathy. Description - No Localized lymphadenopathy.    Assessment & Plan Ardeth Sportsman(Valerie Fredin C. Sarahy Creedon MD; 08/24/2015 5:27 PM)  UMBILICAL HERNIA WITHOUT OBSTRUCTION AND WITHOUT GANGRENE (K42.9) Impression: Moderate but sensitive umbilical hernia in a young, obese woman with diastases. Given her young age and activity level and obesity, think she would benefit from mesh reinforcement with repair. Laparoscopic underlay repair.  She is interested in proceeding. No new events. Radiosurgery. Her husband and family agree.  Current Plans You are being scheduled for surgery - Our schedulers will call you.  You should hear from our office's scheduling department within 5 working days about the location, date, and time of surgery. We try to make accommodations for patient's preferences in scheduling surgery, but sometimes the OR schedule or the surgeon's schedule prevents us from making those accommodations.  If you have not heard from our office [phone]) in 5 working days, call the office and ask for your surgeon's nurse.  If you have other questions about your diagnosis, plan, or surgery, call the office and ask for your surgeon's nurse.  Written instructions provided The anatomy & physiology of the abdominal wall was discussed. The pathophysiology of hernias  was discussed. Natural history risks without surgery including progeressive enlargement, pain, incarceration, & strangulation was discussed. Contributors to complications such as smoking, obesity, diabetes, prior surgery, etc were discussed.  I feel the risks of no intervention will lead to serious problems that outweigh the operative risks; therefore, I recommended surgery to reduce and repair the hernia. I explained laparoscopic techniques with possible need for an open approach. I noted the probable use of mesh to patch and/or buttress the hernia repair  Risks such as bleeding, infection, abscess, need for further treatment, heart attack, death, and other risks were discussed. I noted a good likelihood this will help address the problem. Goals of post-operative recovery were discussed as well. Possibility that this will not correct all symptoms was explained. I stressed the importance of low-impact activity, aggressive pain control, avoiding constipation, & not pushing through pain to minimize risk of post-operative chronic pain or injury. Possibility of reherniation especially with smoking, obesity, diabetes, immunosuppression, and other health conditions was discussed. We will work to minimize complications.  An educational handout further explaining the pathology & treatment options was given as well. Questions were answered. The patient expresses understanding & wishes to proceed with surgery.  Pt Education - [facility] Pain Control ([facility]) Pt Education - [facility] Hernia Post-Op HCI ([facility]irth): discussed with patient and provided information.  Ardeth SportsmanSteven C. Marilin Kofman, M.D., F.A.C.S. Gastrointestinal and Minimally Invasive Surgery [facility] [address] [place], [place] [id number] [phone] Main / Paging

## 2015-11-28 ENCOUNTER — Other Ambulatory Visit: Payer: Self-pay | Admitting: Surgery

## 2015-11-28 DIAGNOSIS — Z9889 Other specified postprocedural states: Principal | ICD-10-CM

## 2015-11-28 DIAGNOSIS — Z8719 Personal history of other diseases of the digestive system: Secondary | ICD-10-CM

## 2015-12-02 ENCOUNTER — Inpatient Hospital Stay: Admission: RE | Admit: 2015-12-02 | Payer: Self-pay | Source: Ambulatory Visit

## 2015-12-16 ENCOUNTER — Other Ambulatory Visit: Payer: Self-pay

## 2015-12-23 ENCOUNTER — Other Ambulatory Visit: Payer: Self-pay

## 2015-12-30 ENCOUNTER — Ambulatory Visit
Admission: RE | Admit: 2015-12-30 | Discharge: 2015-12-30 | Disposition: A | Discharge status: Home / Self Care | Payer: Medicaid Other | Source: Ambulatory Visit | Attending: Surgery | Admitting: Surgery

## 2015-12-30 DIAGNOSIS — Z9889 Other specified postprocedural states: Principal | ICD-10-CM

## 2015-12-30 DIAGNOSIS — Z8719 Personal history of other diseases of the digestive system: Secondary | ICD-10-CM

## 2015-12-30 MED ORDER — IOPAMIDOL (ISOVUE-300) INJECTION 61%
125.0000 mL | Freq: Once | INTRAVENOUS | Status: AC | PRN
  Administered 2015-12-30: 125 mL via INTRAVENOUS

## 2016-01-10 IMAGING — CR DG CHEST 2V
2 series · 2 of 2 positions shown · non-contrast
Comparison: None.

CLINICAL DATA: 28-year-old female with a history of dyspnea.

EXAM:
CHEST - 2 VIEW

[chest lat (1 of 2)]
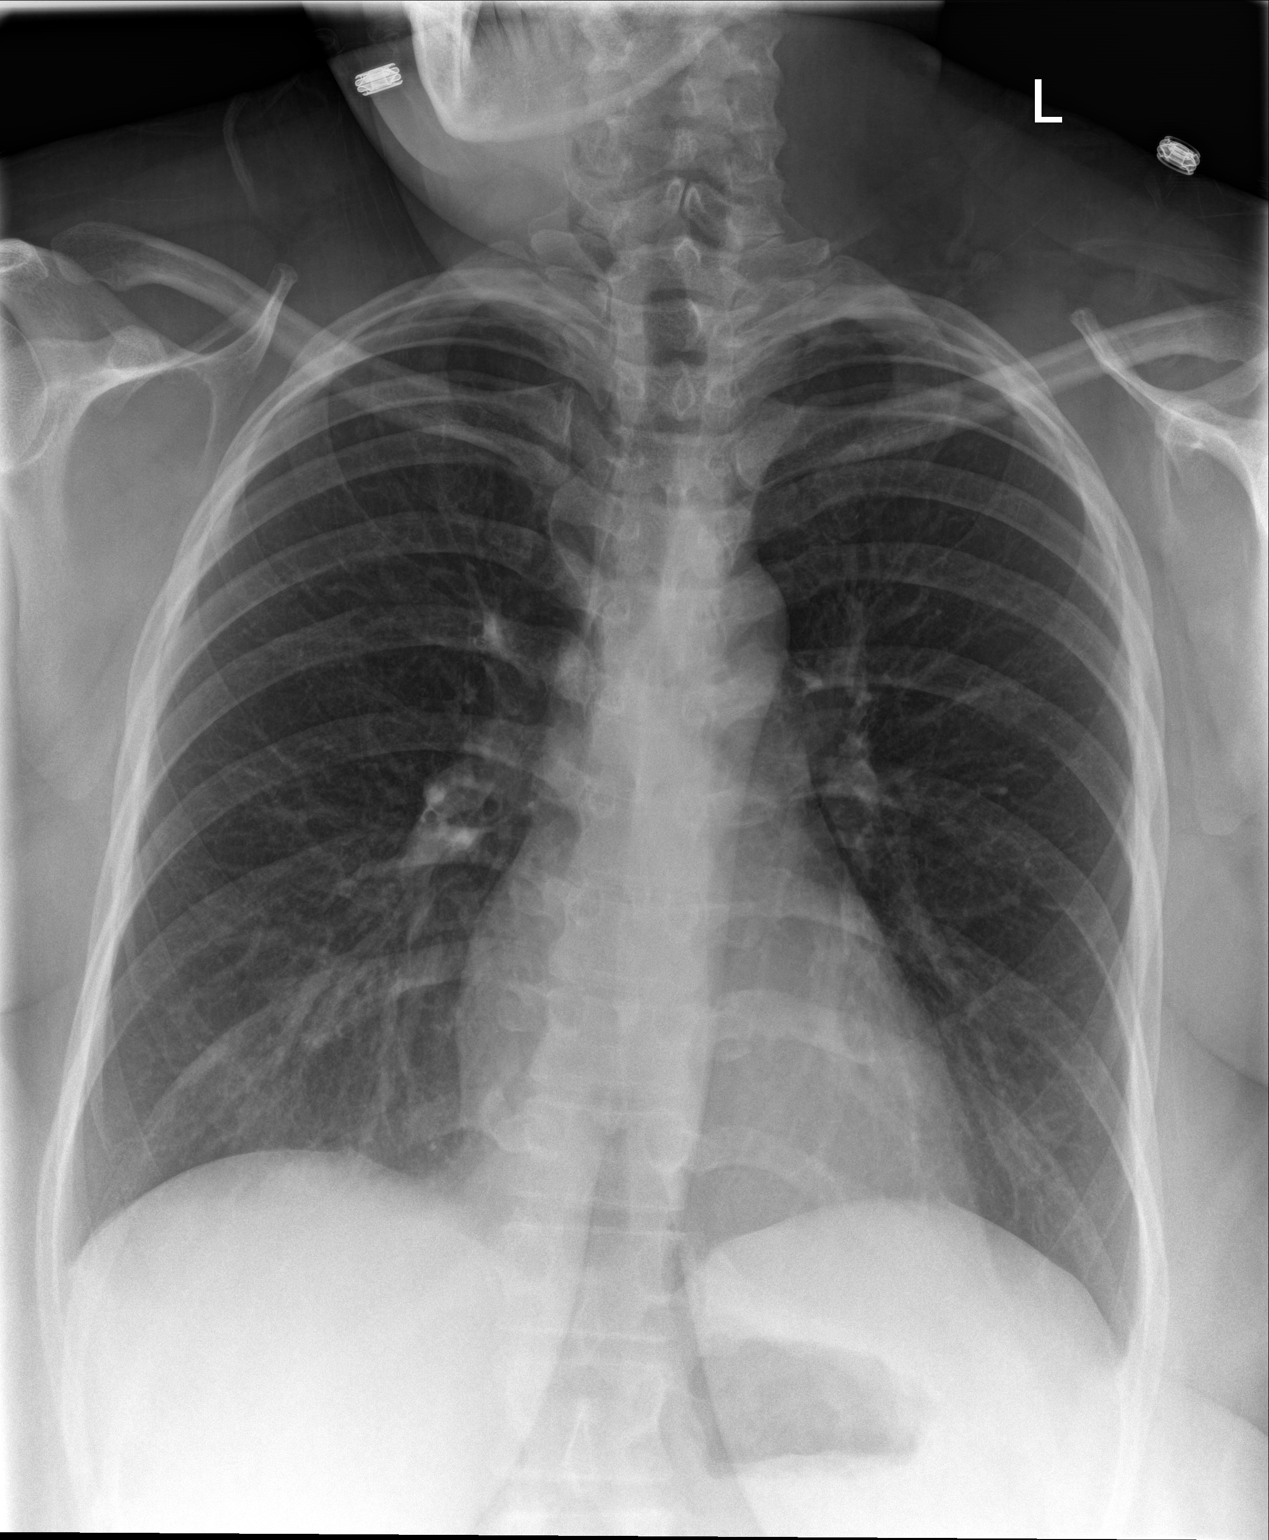

[chest lat (2 of 2)]
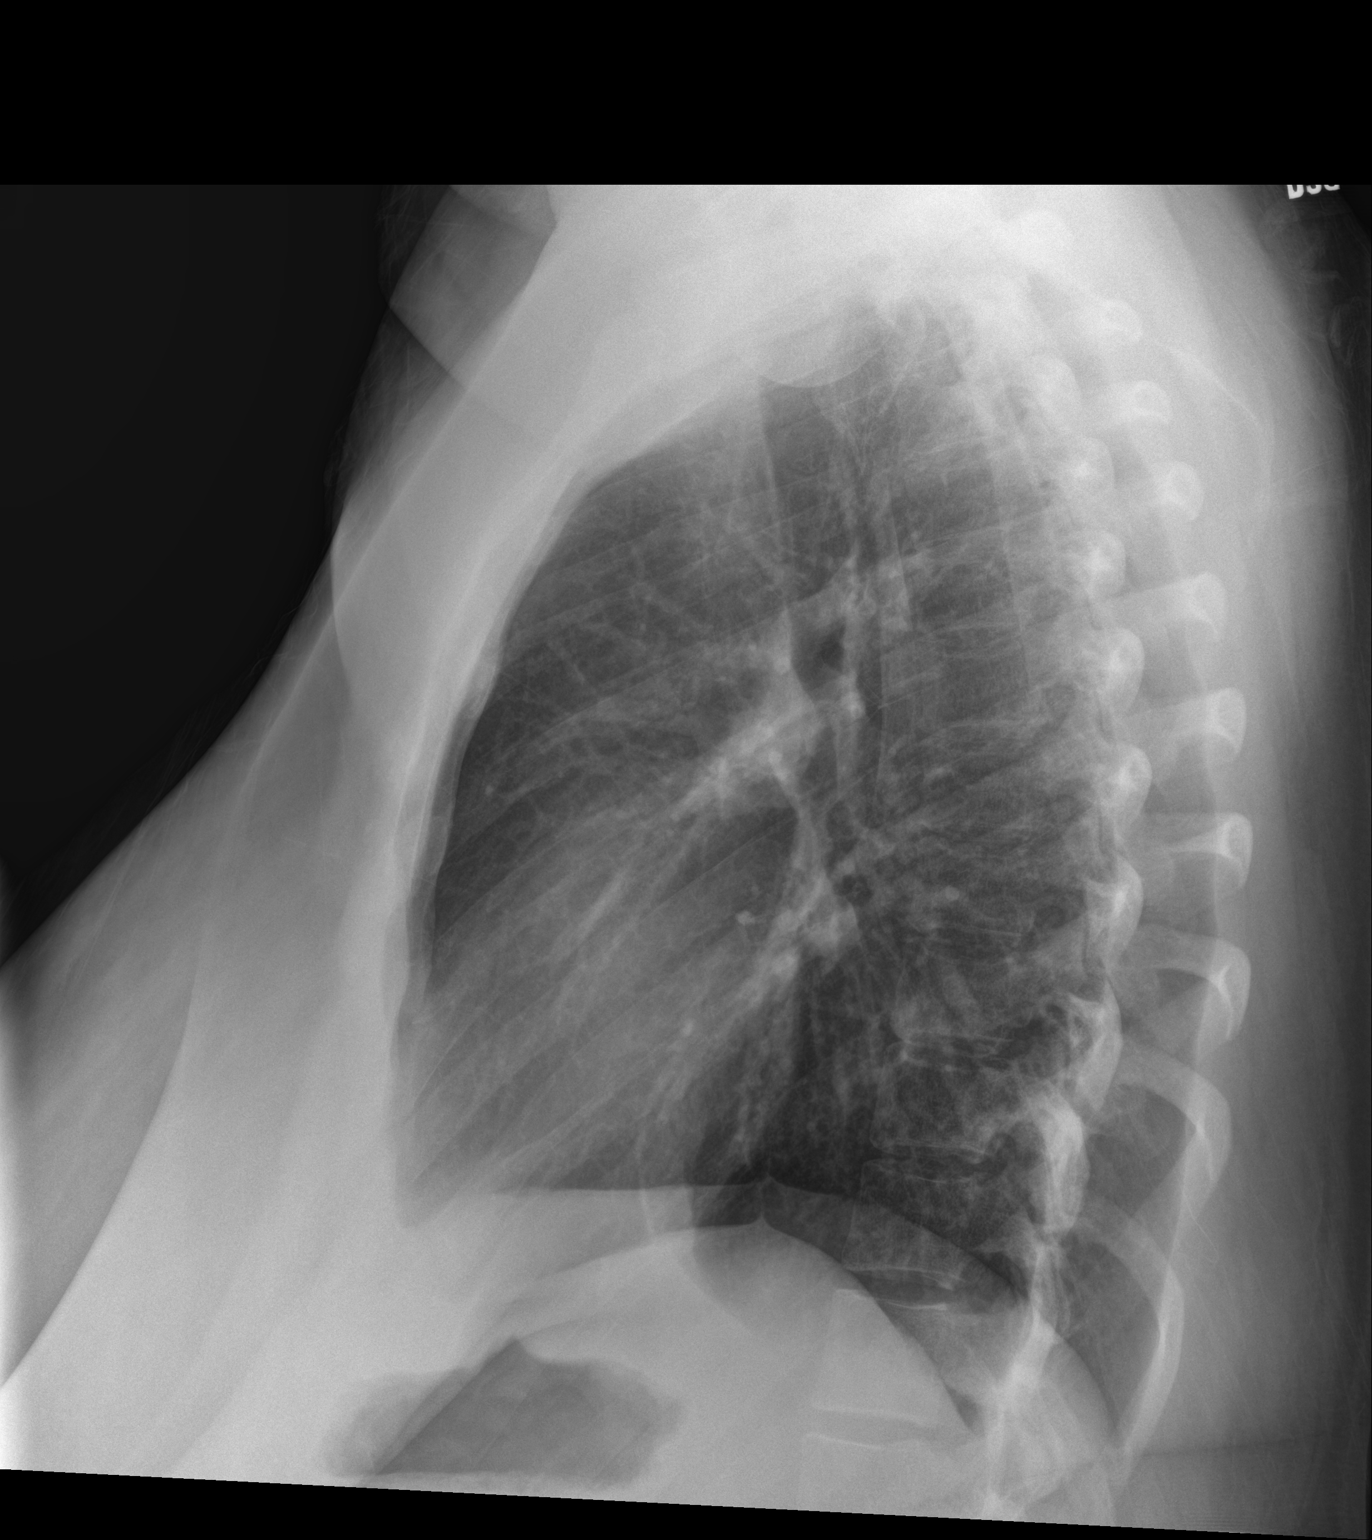

[2 of 2 positions shown; findings below may reference images not displayed]

FINDINGS: Cardiomediastinal silhouette projects within normal limits in size
and contour. No confluent airspace disease, pneumothorax, or pleural
effusion.

No displaced fracture.

Unremarkable appearance of the upper abdomen.
IMPRESSION: No radiographic evidence of acute cardiopulmonary disease.

## 2016-05-22 ENCOUNTER — Encounter (HOSPITAL_COMMUNITY): Payer: Self-pay | Admitting: Emergency Medicine

## 2016-05-22 DIAGNOSIS — N3001 Acute cystitis with hematuria: Secondary | ICD-10-CM | POA: Insufficient documentation

## 2016-05-22 LAB — URINALYSIS, ROUTINE W REFLEX MICROSCOPIC
BILIRUBIN URINE: NEGATIVE
Glucose, UA: NEGATIVE mg/dL
KETONES UR: 15 mg/dL — AB
NITRITE: POSITIVE — AB
PH: 6 (ref 5.0–8.0)
Protein, ur: >300 mg/dL — AB
Specific Gravity, Urine: 1.009 (ref 1.005–1.030)

## 2016-05-22 LAB — URINE MICROSCOPIC-ADD ON

## 2016-05-22 LAB — POC URINE PREG, ED: Preg Test, Ur: NEGATIVE

## 2016-05-22 NOTE — ED Triage Notes (Signed)
Pt states "I have a UTi and it hurts when I pee and theres blood in my urine, I feel like my bladder is full and I saw my doctor and they gave me medicine for three weeks ago and it went away but it came back." pt stated she took all her antibiotic.

## 2016-05-23 ENCOUNTER — Emergency Department (HOSPITAL_COMMUNITY)
Admission: EM | Admit: 2016-05-23 | Discharge: 2016-05-23 | Disposition: A | Discharge status: Home / Self Care | Payer: Medicaid Other | Attending: Emergency Medicine | Admitting: Emergency Medicine

## 2016-05-23 DIAGNOSIS — N3001 Acute cystitis with hematuria: Secondary | ICD-10-CM

## 2016-05-23 MED ORDER — SODIUM CHLORIDE 0.9 % IV BOLUS (SEPSIS): 1000.0000 mL | Freq: Once | INTRAVENOUS | Status: DC

## 2016-05-23 MED ORDER — CIPROFLOXACIN HCL 500 MG PO TABS
500.0000 mg | ORAL_TABLET | Freq: Once | ORAL | Status: AC
Start: 2016-05-23 — End: 2016-05-23
  Administered 2016-05-23: 500 mg via ORAL
  Filled 2016-05-23: qty 1

## 2016-05-23 MED ORDER — PHENAZOPYRIDINE HCL 200 MG PO TABS: 200.0000 mg | ORAL_TABLET | Freq: Three times a day (TID) | ORAL | 0 refills | Status: DC

## 2016-05-23 MED ORDER — CIPROFLOXACIN HCL 500 MG PO TABS: 500.0000 mg | ORAL_TABLET | Freq: Two times a day (BID) | ORAL | 0 refills | Status: DC

## 2016-05-23 MED ORDER — DEXTROSE 5 % IV SOLN: 1.0000 g | Freq: Once | INTRAVENOUS | Status: DC

## 2016-05-23 MED ORDER — PHENAZOPYRIDINE HCL 100 MG PO TABS
95.0000 mg | ORAL_TABLET | Freq: Once | ORAL | Status: AC
  Administered 2016-05-23: 100 mg via ORAL
  Filled 2016-05-23: qty 1

## 2016-05-23 NOTE — ED Notes (Signed)
Patient able to ambulate independently  

## 2016-05-23 NOTE — ED Provider Notes (Signed)
[facility]-EMERGENCY DEPT Provider Note   CSN: [id number] Arrival date & time: 05/22/16  2241     History   Chief Complaint Chief Complaint  Patient presents with  . Hematuria    HPI Chloe Armstrong is a 30 y.o. female.  HPI  Patient comes to the ER with complaints of UTI. She has been having dysuria and hematuria. She has sensation that her bladder is full.She saw her doctor and was given unknown abx 3 weeks ago for UTI and it resolved but it has come back.Her symptoms started a few days ago. She reports taking all of her abx. She has not had F/N/V/D, weakness or abdominal pains.   Past Medical History:  Diagnosis Date  . Headache    Chronic migraines    Patient Active Problem List   Diagnosis Date Noted  . Indication for care in labor or delivery 01/27/2015  . Postpartum state 01/27/2015    Past Surgical History:  Procedure Laterality Date  . WISDOM TOOTH EXTRACTION      OB History    Gravida Para Term Preterm AB Living   2 2 2     2    SAB TAB Ectopic Multiple Live Births         0 2       Home Medications    Prior to Admission medications   Medication Sig Start Date End Date Taking? Authorizing Provider  ciprofloxacin (CIPRO) 500 MG tablet Take 1 tablet (500 mg total) by mouth 2 (two) times daily. 05/23/16   Kammie Scioli Neva Seat, PA-C  cyclobenzaprine (FLEXERIL) 10 MG tablet Take 1 tablet (10 mg total) by mouth 2 (two) times daily as needed for muscle spasms. Patient not taking: Reported on 05/23/2016 05/05/15   Danelle Berry, PA-C  HYDROcodone-acetaminophen (NORCO/VICODIN) 5-325 MG per tablet Take 2 tablets by mouth every 4 (four) hours as needed. Patient not taking: Reported on 05/23/2016 05/05/15   Danelle Berry, PA-C  ibuprofen (ADVIL,MOTRIN) 800 MG tablet Take 1 tablet (800 mg total) by mouth 3 (three) times daily. Patient not taking: Reported on 05/23/2016 05/05/15   Danelle Berry, PA-C  oxyCODONE-acetaminophen (PERCOCET/ROXICET) 5-325 MG per tablet Take 1 tablet by  mouth every 4 (four) hours as needed (for pain scale 4-7). Patient not taking: Reported on 05/23/2016 01/28/15   Levi Aland, MD  phenazopyridine (PYRIDIUM) 200 MG tablet Take 1 tablet (200 mg total) by mouth 3 (three) times daily. 05/23/16   Marlon Pel, PA-C    Family History No family history on file.  Social History Social History  Substance Use Topics  . Smoking status: Never Smoker  . Smokeless tobacco: Never Used  . Alcohol use No     Allergies   Review of patient's allergies indicates no known allergies.   Review of Systems Review of Systems Review of Systems All other systems negative except as documented in the HPI. All pertinent positives and negatives as reviewed in the HPI.   Physical Exam Updated Vital Signs BP (!) 138/104   Pulse 95   Temp 98.3 F (36.8 C) (Oral)   Resp 18   SpO2 100%   Physical Exam  Constitutional: She appears well-developed and well-nourished.  HENT:  Head: Normocephalic and atraumatic.  Eyes: Conjunctivae are normal. Pupils are equal, round, and reactive to light.  Neck: Trachea normal, normal range of motion and full passive range of motion without pain. Neck supple.  Cardiovascular: Normal rate, regular rhythm and normal pulses.   Pulmonary/Chest: Effort normal and breath sounds  normal. Chest wall is not dull to percussion. She exhibits no tenderness, no crepitus, no edema, no deformity and no retraction.  Abdominal: Soft. Normal appearance and bowel sounds are normal.  Musculoskeletal: Normal range of motion.  Lymphadenopathy:       Head (right side): No submental, no submandibular, no tonsillar, no preauricular, no posterior auricular and no occipital adenopathy present.       Head (left side): No submental, no submandibular, no tonsillar, no preauricular, no posterior auricular and no occipital adenopathy present.    She has no cervical adenopathy.    She has no axillary adenopathy.  Neurological: She is alert. She has  normal strength.  Skin: Skin is warm, dry and intact.  Psychiatric: She has a normal mood and affect. Her speech is normal and behavior is normal. Judgment and thought content normal. Cognition and memory are normal.     ED Treatments / Results  Labs (all labs ordered are listed, but only abnormal results are displayed) Labs Reviewed  URINALYSIS, ROUTINE W REFLEX MICROSCOPIC (NOT [facility]) - Abnormal; Notable for the following:       Result Value   Color, Urine RED (*)    APPearance TURBID (*)    Hgb urine dipstick LARGE (*)    Ketones, ur 15 (*)    Protein, ur >300 (*)    Nitrite POSITIVE (*)    Leukocytes, UA LARGE (*)    All other components within normal limits  URINE MICROSCOPIC-ADD ON - Abnormal; Notable for the following:    Squamous Epithelial / LPF 0-5 (*)    Bacteria, UA MANY (*)    All other components within normal limits  URINE CULTURE  POC URINE PREG, ED    EKG  EKG Interpretation None       Radiology No results found.  Procedures Procedures (including critical care time)  Medications Ordered in ED Medications  ciprofloxacin (CIPRO) tablet 500 mg (not administered)  phenazopyridine (PYRIDIUM) tablet 100 mg (not administered)     Initial Impression / Assessment and Plan / ED Course  I have reviewed the triage vital signs and the nursing notes.  Pertinent labs & imaging results that were available during my care of the patient were reviewed by me and considered in my medical decision making (see chart for details).  Clinical Course    Pt declines IV abx and fluids. Prefers oral abx and to go home, will see PCP tomorrow. Pt well appearing with no signs of systemic illness.  Final Clinical Impressions(s) / ED Diagnoses   Final diagnoses:  Acute cystitis with hematuria    New Prescriptions New Prescriptions   CIPROFLOXACIN (CIPRO) 500 MG TABLET    Take 1 tablet (500 mg total) by mouth 2 (two) times daily.   PHENAZOPYRIDINE (PYRIDIUM) 200 MG  TABLET    Take 1 tablet (200 mg total) by mouth 3 (three) times daily.     Marlon Peliffany Journee Bobrowski, PA-C 05/23/16 0221    Zadie Rhineonald Wickline, MD 05/23/16 [phone]

## 2016-05-24 LAB — URINE CULTURE: Culture: 50000 — AB

## 2016-05-25 ENCOUNTER — Telehealth (HOSPITAL_BASED_OUTPATIENT_CLINIC_OR_DEPARTMENT_OTHER): Payer: Self-pay

## 2016-05-25 NOTE — Telephone Encounter (Signed)
Post ED Visit - Positive Culture Follow-up  Culture report reviewed by antimicrobial stewardship pharmacist:  []  Chloe Armstrong, Pharm.D. []  Chloe Armstrong, Pharm.D., BCPS []  Chloe Armstrong, Pharm.D. []  Chloe Armstrong, Pharm.D., BCPS []  Marion CenterMinh Armstrong, [address] BoulevardPharm.D., BCPS, AAHIVP []  Chloe Armstrong, Pharm.D., BCPS, AAHIVP []  Tennis Mustassie Armstrong, Pharm.D. []  Sherle Poeob Vincent, [place]. Chloe Armstrong Pharm D Positive urine culture Treated with Ciprofloxacin HCL, organism sensitive to the same and no further patient follow-up is required at this time.  Chloe Armstrong, Chloe Armstrong 05/25/2016, 10:19 AM

## 2016-09-04 ENCOUNTER — Emergency Department (HOSPITAL_COMMUNITY)
Admission: EM | Admit: 2016-09-04 | Discharge: 2016-09-04 | Disposition: A | Discharge status: Home / Self Care | Payer: Medicaid Other | Attending: Emergency Medicine | Admitting: Emergency Medicine

## 2016-09-04 ENCOUNTER — Encounter (HOSPITAL_COMMUNITY): Payer: Self-pay | Admitting: Emergency Medicine

## 2016-09-04 DIAGNOSIS — J111 Influenza due to unidentified influenza virus with other respiratory manifestations: Secondary | ICD-10-CM | POA: Insufficient documentation

## 2016-09-04 DIAGNOSIS — R69 Illness, unspecified: Secondary | ICD-10-CM

## 2016-09-04 NOTE — ED Notes (Signed)
Pt states she understands instructions. Home stable with steady gait.All questions answered. 

## 2016-09-04 NOTE — ED Notes (Addendum)
Patient placed in room. 

## 2016-09-04 NOTE — Discharge Instructions (Signed)
Please read and follow all provided instructions.  Your diagnoses today include:  1. Influenza-like illness     Tests performed today include:  Vital signs. See below for your results today.   Medications prescribed:   None  Take any prescribed medications only as directed.  Home care instructions:  Follow any educational materials contained in this packet. Please continue drinking plenty of fluids. Use over-the-counter cold and flu medications as needed as directed on packaging for symptom relief. You may also use ibuprofen or tylenol as directed on packaging for pain or fever.   BE VERY CAREFUL not to take multiple medicines containing Tylenol (also called acetaminophen). Doing so can lead to an overdose which can damage your liver and cause liver failure and possibly death.   Follow-up instructions: Please follow-up with your primary care provider in the next 3 days for further evaluation of your symptoms.   Return instructions:   Please return to the Emergency Department if you experience worsening symptoms.  Please return if you have a high fever greater than 101 degrees not controlled with over-the-counter medications, persistent vomiting and cannot keep down fluids, or worsening trouble breathing.  Please return if you have any other emergent concerns.  Additional Information:  Your vital signs today were: BP 123/88 (BP Location: Left Arm)    Pulse 90    Temp 99.6 F (37.6 C) (Oral)    Resp 16    Ht 5\' 6"  (1.676 m)    Wt 99.3 kg    SpO2 100%    BMI 35.35 kg/m  If your blood pressure (BP) was elevated above 135/85 this visit, please have this repeated by your doctor within one month.

## 2016-09-04 NOTE — ED Provider Notes (Signed)
[facility]EMERG[facility]Y DEPT Provider Note   CSN: [id number] Arrival date & time: 09/04/16  0900  By signing my name below, I, Chloe Armstrong, attest that this documentation has been prepared under the direction and in the presence of  [name] PA-C. Electronically Signed: Clovis PuAvnee Armstrong, ED Scribe. 09/04/16. 11:25 AM.   History   Chief Complaint Chief Complaint  Patient presents with  . Influenza   The history is provided by the patient. No language interpreter was used.   HPI Comments:  Chloe Armstrong is a 31 y.o. female who presents to the Emergency Department complaining of acute onset generalized body aches x 2 days. Pt also reports a productive cough and chills. 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 She has taken Theraflu with no relief. Pt denies chest pain and any other associated symptoms at this time. No flu shot this year.   Past Medical History:  Diagnosis Date  . Headache    Chronic migraines    Patient Active Problem List   Diagnosis Date Noted  . Indication for care in labor or delivery 01/27/2015  . Postpartum state 01/27/2015    Past Surgical History:  Procedure Laterality Date  . WISDOM TOOTH EXTRACTION      OB History    Gravida Para Term Preterm AB Living   2 2 2     2    SAB TAB Ectopic Multiple Live Births         0 2       Home Medications    Prior to Admission medications   Medication Sig Start Date End Date Taking? Authorizing Provider  ciprofloxacin (CIPRO) 500 MG tablet Take 1 tablet (500 mg total) by mouth 2 (two) times daily. 05/23/16   [name], PA-C  cyclobenzaprine (FLEXERIL) 10 MG tablet Take 1 tablet (10 mg total) by mouth 2 (two) times daily as needed for muscle spasms. Patient not taking: Reported on 05/23/2016 05/05/15   [name], PA-C  HYDROcodone-acetaminophen (NORCO/VICODIN) 5-325 MG per tablet Take 2 tablets by mouth every 4 (four) hours as needed. Patient not taking: Reported on 05/23/2016 05/05/15    [name], PA-C  ibuprofen (ADVIL,MOTRIN) 800 MG tablet Take 1 tablet (800 mg total) by mouth 3 (three) times daily. Patient not taking: Reported on 05/23/2016 05/05/15   [name], PA-C  oxyCODONE-acetaminophen (PERCOCET/ROXICET) 5-325 MG per tablet Take 1 tablet by mouth every 4 (four) hours as needed (for pain scale 4-7). Patient not taking: Reported on 05/23/2016 01/28/15   [name], MD  phenazopyridine (PYRIDIUM) 200 MG tablet Take 1 tablet (200 mg total) by mouth 3 (three) times daily. 05/23/16   [name], PA-C    Family History No family history on file.  Social History Social History  Substance Use Topics  . Smoking status: Never Smoker  . Smokeless tobacco: Never Used  . Alcohol use No     Allergies   Patient has no known allergies.   Review of Systems Review of Systems  Constitutional: Positive for chills. Negative for fatigue and fever.  HENT: Negative for congestion, ear pain, rhinorrhea, sinus pressure and sore throat.   Eyes: Negative for redness.  Respiratory: Positive for cough. Negative for wheezing.   Cardiovascular: Negative for chest pain.  Gastrointestinal: Negative for abdominal pain, diarrhea, nausea and vomiting.  Genitourinary: Negative for dysuria.  Musculoskeletal: Positive for myalgias. Negative for neck stiffness.  Skin: Negative for rash.  Neurological: Negative for headaches.  Hematological: Negative for  adenopathy.     Physical Exam Updated Vital Signs BP 123/88 (BP Location: Left Arm)   Pulse 90   Temp 99.6 F (37.6 C) (Oral)   Resp 16   Ht 5\' 6"  (1.676 m)   Wt 219 lb (99.3 kg)   SpO2 100%   BMI 35.35 kg/m   Physical Exam  Constitutional: She is oriented to person, place, and time. She appears well-developed and well-nourished. No distress.  HENT:  Head: Normocephalic and atraumatic.  Right Ear: Tympanic membrane, external ear and ear canal normal.  Left Ear: Tympanic membrane, external ear and ear canal normal.   Nose: Mucosal edema present. No rhinorrhea.  Mouth/Throat: Uvula is midline and mucous membranes are normal. Mucous membranes are not dry. No oral lesions. No trismus in the jaw. No uvula swelling. Posterior oropharyngeal erythema present. No oropharyngeal exudate, posterior oropharyngeal edema or tonsillar abscesses.  Eyes: Conjunctivae are normal. Right eye exhibits no discharge. Left eye exhibits no discharge.  Neck: Normal range of motion. Neck supple.  Cardiovascular: Normal rate, regular rhythm and normal heart sounds.   Pulmonary/Chest: Effort normal and breath sounds normal. No respiratory distress. She has no wheezes. She has no rales.  Abdominal: Soft. She exhibits no distension. There is no tenderness.  Lymphadenopathy:    She has no cervical adenopathy.  Neurological: She is alert and oriented to person, place, and time.  Skin: Skin is warm and dry.  Psychiatric: She has a normal mood and affect.  Nursing note and vitals reviewed.    ED Treatments / Results  DIAGNOSTIC STUDIES:  Oxygen Saturation is 100% on RA, normal by my interpretation.    COORDINATION OF CARE:  11:24 AM Advised pt to rest, stay hydrated, take ibuprofen and tylenol. Discussed treatment plan with pt at bedside and pt agreed to plan.   Procedures Procedures (including critical care time)  Medications Ordered in ED Medications - No data to display   Initial Impression / Assessment and Plan / ED Course  I have reviewed the triage vital signs and the nursing notes.  Pertinent labs & imaging results that were available during my care of the patient were reviewed by me and considered in my medical decision making (see chart for details).     Patient with symptoms consistent with influenza.  Vitals are stable.  No signs of dehydration, tolerating PO's.  Lungs are clear. Due to patient's presentation and physical exam a chest x-ray was not ordered bc likely diagnosis of flu. Patient will be discharged  with instructions to orally hydrate, rest, and use over-the-counter medications such as anti-inflammatories ibuprofen and Aleve for muscle aches and Tylenol for fever.  Patient will also be given a cough suppressant.    Final Clinical Impressions(s) / ED Diagnoses   Final diagnoses:  Influenza-like illness   Patient with symptoms consistent with influenza. Vitals are stable, low-grade fever. No signs of dehydration, tolerating PO's. Lungs are clear. Supportive therapy indicated with return if symptoms worsen. Patient counseled.   New Prescriptions Discharge Medication List as of 09/04/2016 11:30 AM    I personally performed the services described in this documentation, which was scribed in my presence. The recorded information has been reviewed and is accurate.     Renne Crigler, PA-C 09/04/16 1217    Doug Sou, MD 09/04/16 [phone]

## 2016-09-04 NOTE — ED Triage Notes (Signed)
States has had chills and cough since Sunday dflu s/s

## 2017-04-18 ENCOUNTER — Emergency Department (HOSPITAL_COMMUNITY)
Admission: EM | Admit: 2017-04-18 | Discharge: 2017-04-18 | Disposition: A | Discharge status: Home / Self Care | Payer: Self-pay | Attending: Emergency Medicine | Admitting: Emergency Medicine

## 2017-04-18 ENCOUNTER — Encounter (HOSPITAL_COMMUNITY): Payer: Self-pay | Admitting: *Deleted

## 2017-04-18 DIAGNOSIS — R42 Dizziness and giddiness: Secondary | ICD-10-CM | POA: Insufficient documentation

## 2017-04-18 DIAGNOSIS — E860 Dehydration: Secondary | ICD-10-CM | POA: Insufficient documentation

## 2017-04-18 LAB — CBC
HEMATOCRIT: 38 % (ref 36.0–46.0)
HEMOGLOBIN: 12.7 g/dL (ref 12.0–15.0)
MCH: 29.3 pg (ref 26.0–34.0)
MCHC: 33.4 g/dL (ref 30.0–36.0)
MCV: 87.6 fL (ref 78.0–100.0)
Platelets: 164 10*3/uL (ref 150–400)
RBC: 4.34 MIL/uL (ref 3.87–5.11)
RDW: 12.6 % (ref 11.5–15.5)
WBC: 5 10*3/uL (ref 4.0–10.5)

## 2017-04-18 LAB — URINALYSIS, ROUTINE W REFLEX MICROSCOPIC
BILIRUBIN URINE: NEGATIVE
Glucose, UA: NEGATIVE mg/dL
Hgb urine dipstick: NEGATIVE
KETONES UR: NEGATIVE mg/dL
LEUKOCYTES UA: NEGATIVE
NITRITE: NEGATIVE
PH: 6 (ref 5.0–8.0)
Protein, ur: NEGATIVE mg/dL
Specific Gravity, Urine: 1.02 (ref 1.005–1.030)

## 2017-04-18 LAB — BASIC METABOLIC PANEL
ANION GAP: 8 (ref 5–15)
BUN: 8 mg/dL (ref 6–20)
CALCIUM: 8.8 mg/dL — AB (ref 8.9–10.3)
CO2: 25 mmol/L (ref 22–32)
Chloride: 104 mmol/L (ref 101–111)
Creatinine, Ser: 0.89 mg/dL (ref 0.44–1.00)
GFR calc Af Amer: >60 mL/min (ref >60)
Glucose, Bld: 123 mg/dL — ABNORMAL HIGH (ref 65–99)
POTASSIUM: 3.9 mmol/L (ref 3.5–5.1)
SODIUM: 137 mmol/L (ref 135–145)

## 2017-04-18 LAB — POC URINE PREG, ED: PREG TEST UR: NEGATIVE

## 2017-04-18 LAB — CBG MONITORING, ED: GLUCOSE-CAPILLARY: 105 mg/dL — AB (ref 65–99)

## 2017-04-18 NOTE — ED Provider Notes (Signed)
[facility] Provider Note   CSN: [id number] Arrival date & time: 04/18/17  1616     History   Chief Complaint Chief Complaint  Patient presents with  . Dizziness  . Weakness    HPI Chloe Armstrong is a 31 y.o. female.  This is a 31 year old obese female with PMH of chronic migraines who presents with sudden onset of lightheadedness, lower extremity weakness and "feeling like her legs were giving out "earlier this afternoon.  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  She denies any chest pain, dyspnea, syncopal episode, blurry vision, headaches.  Denies any numbness or tingling in her extremities.  She is currently asymptomatic with no complaints other than being thirsty.  Denies fevers, recent travel, leg swelling.  Denies any family history of sudden cardiac death, denies history of seizures.      Past Medical History:  Diagnosis Date  . Headache    Chronic migraines    Patient Active Problem List   Diagnosis Date Noted  . Indication for care in labor or delivery 01/27/2015  . Postpartum state 01/27/2015    Past Surgical History:  Procedure Laterality Date  . WISDOM TOOTH EXTRACTION      OB History    Gravida Para Term Preterm AB Living   2 2 2     2    SAB TAB Ectopic Multiple Live Births         0 2       Home Medications    Prior to Admission medications   Medication Sig Start Date End Date Taking? Authorizing Provider  ciprofloxacin (CIPRO) 500 MG tablet Take 1 tablet (500 mg total) by mouth 2 (two) times daily. 05/23/16   Marlon PelGreene, Tiffany, PA-C  phenazopyridine (PYRIDIUM) 200 MG tablet Take 1 tablet (200 mg total) by mouth 3 (three) times daily. 05/23/16   Marlon PelGreene, Tiffany, PA-C    Family History No family history on file.  Social History Social History  Substance Use Topics  . Smoking status: Never Smoker  . Smokeless tobacco: Never Used  . Alcohol use No      Allergies   Patient has no known allergies.   Review of Systems Review of Systems  Constitutional: Negative for chills, diaphoresis, fever and unexpected weight change.  HENT: Negative for ear pain and sore throat.   Eyes: Negative for pain and visual disturbance.  Respiratory: Negative for cough and shortness of breath.   Cardiovascular: Negative for chest pain and palpitations.  Gastrointestinal: Negative for abdominal pain and vomiting.  Genitourinary: Negative for dysuria and hematuria.  Musculoskeletal: Negative for arthralgias and back pain.  Skin: Negative for color change and rash.  Allergic/Immunologic: Negative for immunocompromised state.  Neurological: Positive for weakness and light-headedness. Negative for dizziness, tremors, seizures, syncope, facial asymmetry, speech difficulty, numbness and headaches.  All other systems reviewed and are negative.    Physical Exam Updated Vital Signs BP (!) 119/93 (BP Location: Right Arm)   Pulse 73   Temp 97.9 F (36.6 C) (Oral)   Resp 15   SpO2 100%   Physical Exam  Constitutional: She is oriented to person, place, and time. She appears well-developed and well-nourished. No distress.  HENT:  Head: Normocephalic and atraumatic.  Eyes: Conjunctivae are normal.  Neck: Neck supple.  Cardiovascular: Normal rate, regular rhythm and intact distal pulses.   No murmur heard. Pulmonary/Chest: Effort normal and breath sounds normal. No  respiratory distress.  Abdominal: Soft. There is no tenderness.  Musculoskeletal: She exhibits no edema.  Neurological: She is alert and oriented to person, place, and time. She has normal strength. She displays no tremor. No cranial nerve deficit or sensory deficit. Coordination and gait normal.  Skin: Skin is warm and dry.  Psychiatric: She has a normal mood and affect.  Nursing note and vitals reviewed.    ED Treatments / Results  Labs (all labs ordered are listed, but only abnormal  results are displayed) Labs Reviewed  BASIC METABOLIC PANEL - Abnormal; Notable for the following:       Result Value   Glucose, Bld 123 (*)    Calcium 8.8 (*)    All other components within normal limits  CBG MONITORING, ED - Abnormal; Notable for the following:    Glucose-Capillary 105 (*)    All other components within normal limits  CBC  URINALYSIS, ROUTINE W REFLEX MICROSCOPIC  POC URINE PREG, ED    EKG  EKG Interpretation  Date/Time:  Thursday April 18 2017 16:24:57 EDT Ventricular Rate:  71 PR Interval:  164 QRS Duration: 80 QT Interval:  386 QTC Calculation: 419 R Axis:   51 Text Interpretation:  Normal sinus rhythm with sinus arrhythmia Normal ECG NO STEMI NO BRUGADA No old tracing to compare Confirmed by Drema Pry [phone]) on 04/18/2017 6:37:18 PM       Radiology No results found.  Procedures Procedures (including critical care time)  Medications Ordered in ED Medications - No data to display   Initial Impression / Assessment and Plan / ED Course  I have reviewed the triage vital signs and the nursing notes.  Pertinent labs & imaging results that were available during my care of the patient were reviewed by me and considered in my medical decision making (see chart for details).     This is a 31 year old obese female with PMH of chronic migraines who presents with sudden onset of lightheadedness, lower extremity weakness and "feeling like her legs were giving out" earlier this afternoon.   Physical exam benign, no focal neurological deficits, intact distal pulses. No nystagmus, patient denies n/v, no gait difficulty. CBG on presentation 105.  EKG reviewed. Shows normal sinus rhythm and rate, without evidence of ST or T wave changes of myocardial ischemia.    HCG negative. Urine shows no infectious etiology. CBC without anemia or leukocytosis. BMP within normal range.   No EKG findings of HOCM, WPW, Brugada, pre-excitation or prolonged QT. No  tachycardia, no right ventricular heart strain suggestive of PE.   Patient is 31 years old with no personal history or family history of cardiac disease <8 years old, therefore no further cardiac workup is indicated at this time. Patient is well-appearing, reassuring physical exam and clinical presentation.  Patient will be discharged home is follow up with PCP. Patient in agreement with plan.   Return precautions given. All questions answered.  Final Clinical Impressions(s) / ED Diagnoses   Final diagnoses:  Dehydration  Dizziness    New Prescriptions New Prescriptions   No medications on file     Shaune Pollack, MD 04/18/17 2039    Nira Conn, MD 04/19/17 [phone]

## 2017-04-18 NOTE — ED Triage Notes (Signed)
Pt states she has been shaky all day and then stood up her knees buckled up. No pain and ems gave her a normal blood sugar reading.  PT denies recent sickness and no pain.  Reports leg and arm tingling

## 2017-04-18 NOTE — ED Notes (Signed)
Patient verbalized understanding of discharge instructions and denies any further needs or questions at this time. VS stable. Patient ambulatory with steady gait. She denies dizziness at this time, just reports tingling in her arms that comes and goes. Patient declined wheelchair, RN escorted to ED entrance.

## 2017-07-10 ENCOUNTER — Emergency Department (HOSPITAL_COMMUNITY)
Admission: EM | Admit: 2017-07-10 | Discharge: 2017-07-10 | Disposition: A | Discharge status: Home / Self Care | Payer: Self-pay | Attending: Emergency Medicine | Admitting: Emergency Medicine

## 2017-07-10 ENCOUNTER — Emergency Department (HOSPITAL_COMMUNITY): Payer: Self-pay

## 2017-07-10 ENCOUNTER — Other Ambulatory Visit: Payer: Self-pay

## 2017-07-10 DIAGNOSIS — J00 Acute nasopharyngitis [common cold]: Secondary | ICD-10-CM | POA: Insufficient documentation

## 2017-07-10 DIAGNOSIS — J029 Acute pharyngitis, unspecified: Secondary | ICD-10-CM

## 2017-07-10 DIAGNOSIS — Z79899 Other long term (current) drug therapy: Secondary | ICD-10-CM | POA: Insufficient documentation

## 2017-07-10 LAB — RAPID STREP SCREEN (MED CTR MEBANE ONLY): STREPTOCOCCUS, GROUP A SCREEN (DIRECT): NEGATIVE

## 2017-07-10 MED ORDER — NAPROXEN 250 MG PO TABS: 250.0000 mg | ORAL_TABLET | Freq: Two times a day (BID) | ORAL | 0 refills | Status: DC

## 2017-07-10 MED ORDER — FLUTICASONE PROPIONATE 50 MCG/ACT NA SUSP
2.0000 | Freq: Every day | NASAL | 0 refills | Status: DC
Start: 2017-07-10 — End: 2017-08-27

## 2017-07-10 MED ORDER — CETIRIZINE HCL 10 MG PO TABS: 10.0000 mg | ORAL_TABLET | Freq: Every day | ORAL | 1 refills | Status: DC

## 2017-07-10 NOTE — ED Triage Notes (Signed)
Pt here with c/o sore throat and cough with congestion and hoarse voice for 2 days

## 2017-07-10 NOTE — ED Provider Notes (Signed)
[facility]   CSN: [id number] Arrival date & time: 07/10/17  0941     History   Chief Complaint Chief Complaint  Patient presents with  . Sore Throat  . Cough    HPI Chloe Armstrong is a 31 y.o. female.  Chloe Armstrong is a 31 y.o. Female who presents to the ED complaining of sore throat, sneezing, nasal congestion, postnasal drip, cough and hoarse voice for the past 2 days.  She has had no fevers. She reports cough has been mild.  She took an Alka-Seltzer flu medication with some relief of her symptoms.  She denies any trouble swallowing.  She has had no wheezing or trouble breathing.  She denies fevers, shortness of breath, wheezing, vomiting, diarrhea, rashes, trouble moving her neck, changes to her vision, or chest pain.    The history is provided by the patient and medical records. No language interpreter was used.  Sore Throat  Pertinent negatives include no abdominal pain and no shortness of breath.  Cough  Associated symptoms include rhinorrhea and sore throat. Pertinent negatives include no chills and no shortness of breath.    Past Medical History:  Diagnosis Date  . Headache    Chronic migraines    Patient Active Problem List   Diagnosis Date Noted  . Indication for care in labor or delivery 01/27/2015  . Postpartum state 01/27/2015    Past Surgical History:  Procedure Laterality Date  . WISDOM TOOTH EXTRACTION      OB History    Gravida Para Term Preterm AB Living   2 2 2     2    SAB TAB Ectopic Multiple Live Births         0 2       Home Medications    Prior to Admission medications   Medication Sig Start Date End Date Taking? Authorizing Provider  cetirizine (ZYRTEC ALLERGY) 10 MG tablet Take 1 tablet (10 mg total) by mouth daily. 07/10/17   Everlene Farrieransie, Teejay Meader, PA-C  ciprofloxacin (CIPRO) 500 MG tablet Take 1 tablet (500 mg total) by mouth 2 (two) times daily. 05/23/16   Marlon PelGreene, Tiffany,  PA-C  fluticasone (FLONASE) 50 MCG/ACT nasal spray Place 2 sprays into both nostrils daily. 07/10/17   Everlene Farrieransie, Laelia Angelo, PA-C  naproxen (NAPROSYN) 250 MG tablet Take 1 tablet (250 mg total) by mouth 2 (two) times daily with a meal. 07/10/17   Everlene Farrieransie, Alleah Dearman, PA-C  phenazopyridine (PYRIDIUM) 200 MG tablet Take 1 tablet (200 mg total) by mouth 3 (three) times daily. 05/23/16   Marlon PelGreene, Tiffany, PA-C    Family History No family history on file.  Social History Social History   Tobacco Use  . Smoking status: Never Smoker  . Smokeless tobacco: Never Used  Substance Use Topics  . Alcohol use: No  . Drug use: No     Allergies   Patient has no known allergies.   Review of Systems Review of Systems  Constitutional: Negative for chills and fever.  HENT: Positive for congestion, postnasal drip, rhinorrhea, sore throat and voice change. Negative for trouble swallowing.   Eyes: Negative for visual disturbance.  Respiratory: Positive for cough. Negative for shortness of breath.   Gastrointestinal: Negative for abdominal pain, diarrhea, nausea and vomiting.  Musculoskeletal: Negative for neck pain and neck stiffness.  Skin: Negative for rash and wound.  Neurological: Negative for syncope.     Physical Exam Updated Vital Signs BP (!) 141/81 (BP Location: Right  Arm)   Pulse 85   Temp 98.1 F (36.7 C) (Oral)   Resp 14   LMP 06/27/2017 (Exact Date)   SpO2 100%   Physical Exam  Constitutional: She appears well-developed and well-nourished.  Non-toxic appearance. She does not appear ill. No distress.  HENT:  Head: Normocephalic and atraumatic.  Right Ear: Tympanic membrane normal.  Left Ear: Tympanic membrane normal.  Mouth/Throat: Uvula is midline and oropharynx is clear and moist. No uvula swelling. No oropharyngeal exudate, posterior oropharyngeal edema, posterior oropharyngeal erythema or tonsillar abscesses. No tonsillar exudate.  Rhinorrhea present bilaterally.  Eyes:  Conjunctivae are normal. Pupils are equal, round, and reactive to light. Right eye exhibits no discharge. Left eye exhibits no discharge.  Neck: Neck supple.  Cardiovascular: Normal rate, regular rhythm, normal heart sounds and intact distal pulses.  Pulmonary/Chest: Effort normal and breath sounds normal. No stridor. No respiratory distress. She has no wheezes. She has no rhonchi. She has no rales.  Lungs are clear to ascultation bilaterally. Symmetric chest expansion bilaterally. No increased work of breathing. No rales or rhonchi.   Abdominal: Soft. There is no tenderness.  Lymphadenopathy:    She has no cervical adenopathy.  Neurological: She is alert. Coordination normal.  Skin: Skin is warm and dry. Capillary refill takes less than 2 seconds. No rash noted. She is not diaphoretic.  Psychiatric: She has a normal mood and affect. Her behavior is normal.  Nursing note and vitals reviewed.    ED Treatments / Results  Labs (all labs ordered are listed, but only abnormal results are displayed) Labs Reviewed  RAPID STREP SCREEN (NOT AT [facility])  CULTURE, GROUP A STREP [facility])    EKG  EKG Interpretation None       Radiology Dg Chest 2 View  Result Date: 07/10/2017 CLINICAL DATA:  31 year old female with cough chest congestion and sore throat for 4 days. Denies fever. EXAM: CHEST  2 VIEW COMPARISON:  12/30/2014. FINDINGS: Normal lung volumes. Normal cardiac size and mediastinal contours. Visualized tracheal air column is within normal limits. No pneumothorax, pulmonary edema, pleural effusion or confluent pulmonary opacity. Stable to mildly increased basilar predominant interstitial markings in both lungs. No acute osseous abnormality identified. Negative visible bowel gas pattern. IMPRESSION: Negative aside from questionable increased basilar predominant pulmonary interstitial markings compared to 2016. Consider viral/atypical respiratory infection. Electronically Signed   By: Odessa FlemingH  Hall  M.D.   On: 07/10/2017 10:32    Procedures Procedures (including critical care time)  Medications Ordered in ED Medications - No data to display   Initial Impression / Assessment and Plan / ED Course  I have reviewed the triage vital signs and the nursing notes.  Pertinent labs & imaging results that were available during my care of the patient were reviewed by me and considered in my medical decision making (see chart for details).    This is a 31 y.o. Female who presents to the ED complaining of sore throat, sneezing, nasal congestion, postnasal drip, cough and hoarse voice for the past 2 days.  She has had no fevers.  She took an Alka-Seltzer flu medication with some relief of her symptoms.  She denies any trouble swallowing.  She has had no wheezing or trouble breathing.  On exam patient is afebrile nontoxic-appearing.  Lungs are clear to auscultation bilaterally.  Rhinorrhea present.  Throat is clear.  Speech is clear and coherent.  Strep test ordered by triage nurse is negative.  Chest x-ray ordered by triage nurses shows no  sign of pneumonia.  Will discharge with prescriptions for Flonase, Zyrtec and naproxen.  Return precautions discussed. I advised the patient to follow-up with their primary care provider this week. I advised the patient to return to the emergency department with new or worsening symptoms or new concerns. The patient verbalized understanding and agreement with plan.      Final Clinical Impressions(s) / ED Diagnoses   Final diagnoses:  Acute nasopharyngitis  Sore throat    ED Discharge Orders        Ordered    fluticasone (FLONASE) 50 MCG/ACT nasal spray  Daily     07/10/17 1208    cetirizine (ZYRTEC ALLERGY) 10 MG tablet  Daily     07/10/17 1208    naproxen (NAPROSYN) 250 MG tablet  2 times daily with meals     07/10/17 1208       Everlene Farrier, PA-C 07/10/17 1215    Cathren Laine, MD 07/10/17 1325

## 2017-07-12 LAB — CULTURE, GROUP A STREP (THRC)

## 2017-08-21 ENCOUNTER — Emergency Department (HOSPITAL_COMMUNITY)
Admission: EM | Admit: 2017-08-21 | Discharge: 2017-08-21 | Disposition: A | Discharge status: Home / Self Care | Payer: Medicaid Other | Attending: Emergency Medicine | Admitting: Emergency Medicine

## 2017-08-21 ENCOUNTER — Encounter (HOSPITAL_COMMUNITY): Payer: Self-pay | Admitting: Emergency Medicine

## 2017-08-21 DIAGNOSIS — Z5321 Procedure and treatment not carried out due to patient leaving prior to being seen by health care provider: Secondary | ICD-10-CM | POA: Insufficient documentation

## 2017-08-21 DIAGNOSIS — R1084 Generalized abdominal pain: Secondary | ICD-10-CM | POA: Insufficient documentation

## 2017-08-21 LAB — URINALYSIS, ROUTINE W REFLEX MICROSCOPIC
Bilirubin Urine: NEGATIVE
Glucose, UA: NEGATIVE mg/dL
Hgb urine dipstick: NEGATIVE
Ketones, ur: NEGATIVE mg/dL
Leukocytes, UA: NEGATIVE
Nitrite: NEGATIVE
Protein, ur: NEGATIVE mg/dL
Specific Gravity, Urine: 1.015 (ref 1.005–1.030)
pH: 6.5 (ref 5.0–8.0)

## 2017-08-21 LAB — POC URINE PREG, ED: Preg Test, Ur: POSITIVE — AB

## 2017-08-21 NOTE — ED Notes (Signed)
[facility] Explained risk of leaving prior to being evaluated by MD.Pt verbalizes understanding.

## 2017-08-21 NOTE — ED Triage Notes (Signed)
Pt to ED c/o abdominal cramping. States [facility] because she found out yesterday that she is pregnant, states 7.5 weeks. Abdominal cramping started this morning, has white discharge, denies bleeding, no N/V. Patient told to come here because the clinic couldn't do anything about her cramping.

## 2017-08-23 ENCOUNTER — Telehealth: Payer: Self-pay | Admitting: General Practice

## 2017-08-23 DIAGNOSIS — O3680X1 Pregnancy with inconclusive fetal viability, fetus 1: Secondary | ICD-10-CM

## 2017-08-23 NOTE — Telephone Encounter (Signed)
[facility] and left message on nurse line stating the patient presented for an ultrasound with an LMP of 06/27/17, making her [id number]- nothing is seen on ultrasound other than gestational sac. Patient has not had bleeding or pain. Ultrasound ordered & scheduled per protocol on 1/24 @ 9am.  Called patient, no answer- left message we are trying to reach you regarding an appt we have scheduled. Please call us back. Will send letter

## 2017-08-26 ENCOUNTER — Inpatient Hospital Stay (HOSPITAL_COMMUNITY)
Admission: AD | Admit: 2017-08-26 | Discharge: 2017-08-26 | Payer: Self-pay | Source: Ambulatory Visit | Attending: Family Medicine | Admitting: Family Medicine

## 2017-08-27 ENCOUNTER — Other Ambulatory Visit: Payer: Self-pay

## 2017-08-27 ENCOUNTER — Encounter (HOSPITAL_COMMUNITY): Payer: Self-pay | Admitting: *Deleted

## 2017-08-27 ENCOUNTER — Inpatient Hospital Stay (HOSPITAL_COMMUNITY): Payer: Medicaid Other

## 2017-08-27 ENCOUNTER — Inpatient Hospital Stay (HOSPITAL_COMMUNITY)
Admission: AD | Admit: 2017-08-27 | Discharge: 2017-08-27 | Disposition: A | Discharge status: Home / Self Care | Payer: Medicaid Other | Source: Ambulatory Visit | Attending: Obstetrics and Gynecology | Admitting: Obstetrics and Gynecology

## 2017-08-27 DIAGNOSIS — O209 Hemorrhage in early pregnancy, unspecified: Secondary | ICD-10-CM | POA: Diagnosis present

## 2017-08-27 DIAGNOSIS — O3680X0 Pregnancy with inconclusive fetal viability, not applicable or unspecified: Secondary | ICD-10-CM

## 2017-08-27 DIAGNOSIS — Z3A08 8 weeks gestation of pregnancy: Secondary | ICD-10-CM | POA: Insufficient documentation

## 2017-08-27 LAB — CBC
HCT: 39.1 % (ref 36.0–46.0)
HEMOGLOBIN: 13.6 g/dL (ref 12.0–15.0)
MCH: 30.5 pg (ref 26.0–34.0)
MCHC: 34.8 g/dL (ref 30.0–36.0)
MCV: 87.7 fL (ref 78.0–100.0)
Platelets: 171 10*3/uL (ref 150–400)
RBC: 4.46 MIL/uL (ref 3.87–5.11)
RDW: 12.9 % (ref 11.5–15.5)
WBC: 8.4 10*3/uL (ref 4.0–10.5)

## 2017-08-27 LAB — URINALYSIS, ROUTINE W REFLEX MICROSCOPIC
Bilirubin Urine: NEGATIVE
GLUCOSE, UA: NEGATIVE mg/dL
Ketones, ur: NEGATIVE mg/dL
NITRITE: NEGATIVE
PH: 5 (ref 5.0–8.0)
PROTEIN: 100 mg/dL — AB
SPECIFIC GRAVITY, URINE: 1.028 (ref 1.005–1.030)

## 2017-08-27 LAB — HCG, QUANTITATIVE, PREGNANCY: hCG, Beta Chain, Quant, S: 25 m[IU]/mL — ABNORMAL HIGH (ref <5)

## 2017-08-27 NOTE — Discharge Instructions (Signed)
Ectopic Pregnancy An ectopic pregnancy is when the fertilized egg attaches (implants) outside the uterus. Most ectopic pregnancies occur in one of the tubes where eggs travel from the ovary to the uterus (fallopian tubes), but the implanting can occur in other locations. In rare cases, ectopic pregnancies occur on the ovary, intestine, pelvis, abdomen, or cervix. In an ectopic pregnancy, the fertilized egg does not have the ability to develop into a normal, healthy baby. A ruptured ectopic pregnancy is one in which tearing or bursting of a fallopian tube causes internal bleeding. Often, there is intense lower abdominal pain, and vaginal bleeding sometimes occurs. Having an ectopic pregnancy can be life-threatening. If this dangerous condition is not treated, it can lead to blood loss, shock, or even death. What are the causes? The most common cause of this condition is damage to one of the fallopian tubes. A fallopian tube may be narrowed or blocked, and that keeps the fertilized egg from reaching the uterus. What increases the risk? This condition is more likely to develop in women of childbearing age who have different levels of risk. The levels of risk can be divided into three categories. High risk  You have gone through infertility treatment.  You have had an ectopic pregnancy before.  You have had surgery on the fallopian tubes, or another surgical procedure, such as an abortion.  You have had surgery to have the fallopian tubes tied (tubal ligation).  You have problems or diseases of the fallopian tubes.  You have been exposed to diethylstilbestrol (DES). This medicine was used until 1971, and it had effects on babies whose mothers took the medicine.  You become pregnant while using an IUD (intrauterine device) for birth control. Moderate risk  You have a history of infertility.  You have had an STI (sexually transmitted infection).  You have a history of pelvic inflammatory  disease (PID).  You have scarring from endometriosis.  You have multiple sexual partners.  You smoke. Low risk  You have had pelvic surgery.  You use vaginal douches.  You became sexually active before age 18. What are the signs or symptoms? Common symptoms of this condition include normal pregnancy symptoms, such as missing a period, nausea, tiredness, abdominal pain, breast tenderness, and bleeding. However, ectopic pregnancy will have additional symptoms, such as:  Pain with intercourse.  Irregular vaginal bleeding or spotting.  Cramping or pain on one side or in the lower abdomen.  Fast heartbeat, low blood pressure, and sweating.  Passing out while having a bowel movement.  Symptoms of a ruptured ectopic pregnancy and internal bleeding may include:  Sudden, severe pain in the abdomen and pelvis.  Dizziness, weakness, light-headedness, or fainting.  Pain in the shoulder or neck area.  How is this diagnosed? This condition is diagnosed by:  A pelvic exam to locate pain or a mass in the abdomen.  A pregnancy test. This blood test checks for the presence as well as the specific level of pregnancy hormone in the bloodstream.  Ultrasound. This is performed if a pregnancy test is positive. In this test, a probe is inserted into the vagina. The probe will detect a fetus, possibly in a location other than the uterus.  Taking a sample of uterus tissue (dilation and curettage, or D&C).  Surgery to perform a visual exam of the inside of the abdomen using a thin, lighted tube that has a tiny camera on the end (laparoscope).  Culdocentesis. This procedure involves inserting a needle at the top   of the vagina, behind the uterus. If blood is present in this area, it may indicate that a fallopian tube is torn.  How is this treated? This condition is treated with medicine or surgery. Medicine  An injection of a medicine (methotrexate) may be given to cause the pregnancy tissue  to be absorbed. This medicine may save your fallopian tube. It may be given if: ? The diagnosis is made early, with no signs of active bleeding. ? The fallopian tube has not ruptured. ? You are considered to be a good candidate for the medicine. Usually, pregnancy hormone blood levels are checked after methotrexate treatment. This is to be sure that the medicine is effective. It may take 4-6 weeks for the pregnancy to be absorbed. Most pregnancies will be absorbed by 3 weeks. Surgery  A laparoscope may be used to remove the pregnancy tissue.  If severe internal bleeding occurs, a larger cut (incision) may be made in the lower abdomen (laparotomy) to remove the fetus and placenta. This is done to stop the bleeding.  Part or all of the fallopian tube may be removed (salpingectomy) along with the fetus and placenta. The fallopian tube may also be repaired during the surgery.  In very rare circumstances, removal of the uterus (hysterectomy) may be required.  After surgery, pregnancy hormone testing may be done to be sure that there is no pregnancy tissue left. Whether your treatment is medicine or surgery, you may receive a Rho (D) immune globulin shot to prevent problems with any future pregnancy. This shot may be given if:  You are Rh-negative and the baby's father is Rh-positive.  You are Rh-negative and you do not know the Rh type of the baby's father.  Follow these instructions at home:  Rest and limit your activity after the procedure for as long as told by your health care provider.  Until your health care provider says that it is safe: ? Do not lift anything that is heavier than 10 lb (4.5 kg), or the limit that your health care provider tells you. ? Avoid physical exercise and any movement that requires effort (is strenuous).  To help prevent constipation: ? Eat a healthy diet that includes fruits, vegetables, and whole grains. ? Drink 6-8 glasses of water per day. Get help  right away if:  You develop worsening pain that is not relieved by medicine.  You have: ? A fever or chills. ? Vaginal bleeding. ? Redness and swelling at the incision site. ? Nausea and vomiting.  You feel dizzy or weak.  You feel light-headed or you faint. This information is not intended to replace advice given to you by your health care provider. Make sure you discuss any questions you have with your health care provider. Document Released: 09/06/2004 Document Revised: 03/28/2016 Document Reviewed: 02/29/2016 Elsevier Interactive Patient Education  2018 [employer]  

## 2017-08-27 NOTE — MAU Note (Signed)
Pt reports heavy dark discharge and cramping. Positive pregnancy test last week.

## 2017-08-27 NOTE — MAU Provider Note (Signed)
History     CSN: [id number]  Arrival date and time: 08/27/17 1135   First Provider Initiated Contact with Patient 08/27/17 1336      Chief Complaint  Patient presents with  . Vaginal Bleeding   Vaginal Bleeding  The patient's primary symptoms include pelvic pain and vaginal bleeding. This is a new problem. Episode onset: about 5 days ago.  The problem occurs intermittently. The problem has been unchanged. Pain severity now: 8/10  The problem affects both sides. She is pregnant. Pertinent negatives include no chills, dysuria, fever, frequency, nausea, urgency or vomiting. The vaginal bleeding is spotting (started pink, but today it was dark brown. ). She has not been passing clots. She has not been passing tissue. Nothing aggravates the symptoms. She has tried acetaminophen for the symptoms. The treatment provided no relief. She is sexually active. She uses nothing for contraception. Her menstrual history has been regular (lmp 06/27/17).    Past Medical History:  Diagnosis Date  . Headache    Chronic migraines    Past Surgical History:  Procedure Laterality Date  . WISDOM TOOTH EXTRACTION      History reviewed. No pertinent family history.  Social History   Tobacco Use  . Smoking status: Never Smoker  . Smokeless tobacco: Never Used  Substance Use Topics  . Alcohol use: No  . Drug use: No    Allergies:  Allergies  Allergen Reactions  . Tramadol Shortness Of Breath    Medications Prior to Admission  Medication Sig Dispense Refill Last Dose  . cetirizine (ZYRTEC ALLERGY) 10 MG tablet Take 1 tablet (10 mg total) by mouth daily. 30 tablet 1   . ciprofloxacin (CIPRO) 500 MG tablet Take 1 tablet (500 mg total) by mouth 2 (two) times daily. 14 tablet 0   . fluticasone (FLONASE) 50 MCG/ACT nasal spray Place 2 sprays into both nostrils daily. 16 g 0   . naproxen (NAPROSYN) 250 MG tablet Take 1 tablet (250 mg total) by mouth 2 (two) times daily with a meal. 30 tablet 0   .  phenazopyridine (PYRIDIUM) 200 MG tablet Take 1 tablet (200 mg total) by mouth 3 (three) times daily. 6 tablet 0     Review of Systems  Constitutional: Negative for chills and fever.  Gastrointestinal: Negative for nausea and vomiting.  Genitourinary: Positive for pelvic pain and vaginal bleeding. Negative for dysuria, frequency and urgency.   Physical Exam   Blood pressure 123/86, pulse 71, temperature 97.8 F (36.6 C), temperature source Oral, resp. rate 16, height 5\' 6"  (1.676 m), weight 221 lb (100.2 kg), last menstrual period 06/27/2017, SpO2 100 %, unknown if currently breastfeeding.  Physical Exam  Nursing note and vitals reviewed. Constitutional: She is oriented to person, place, and time. She appears well-developed and well-nourished. No distress.  HENT:  Head: Normocephalic.  Cardiovascular: Normal rate.  Respiratory: Effort normal.  GI: Soft. There is no tenderness. There is no rebound.  Neurological: She is alert and oriented to person, place, and time.  Skin: Skin is warm and dry.  Psychiatric: She has a normal mood and affect.   Results for orders placed or performed during the hospital encounter of 08/27/17 (from the past 24 hour(s))  Urinalysis, Routine w reflex microscopic     Status: Abnormal   Collection Time: 08/27/17 12:07 PM  Result Value Ref Range   Color, Urine AMBER (A) YELLOW   APPearance CLOUDY (A) CLEAR   Specific Gravity, Urine 1.028 1.005 - 1.030   pH  5.0 5.0 - 8.0   Glucose, UA NEGATIVE NEGATIVE mg/dL   Hgb urine dipstick LARGE (A) NEGATIVE   Bilirubin Urine NEGATIVE NEGATIVE   Ketones, ur NEGATIVE NEGATIVE mg/dL   Protein, ur 324 (A) NEGATIVE mg/dL   Nitrite NEGATIVE NEGATIVE   Leukocytes, UA TRACE (A) NEGATIVE   RBC / HPF TOO NUMEROUS TO COUNT 0 - 5 RBC/hpf   WBC, UA TOO NUMEROUS TO COUNT 0 - 5 WBC/hpf   Bacteria, UA RARE (A) NONE SEEN   Squamous Epithelial / LPF 6-30 (A) NONE SEEN   Mucus PRESENT   CBC     Status: None   Collection Time:  08/27/17  2:03 PM  Result Value Ref Range   WBC 8.4 4.0 - 10.5 K/uL   RBC 4.46 3.87 - 5.11 MIL/uL   Hemoglobin 13.6 12.0 - 15.0 g/dL   HCT 40.1 02.7 - 25.3 %   MCV 87.7 78.0 - 100.0 fL   MCH 30.5 26.0 - 34.0 pg   MCHC 34.8 30.0 - 36.0 g/dL   RDW 66.4 40.3 - 47.4 %   Platelets 171 150 - 400 K/uL  hCG, quantitative, pregnancy     Status: Abnormal   Collection Time: 08/27/17  2:03 PM  Result Value Ref Range   hCG, Beta Chain, Quant, S 25 (H) <5 mIU/mL   US Ob Comp Less 14 Wks  Result Date: 08/27/2017 CLINICAL DATA:  Vaginal bleeding in first trimester of pregnancy; 8 weeks 5 days EGA by LMP; quantitative beta hCG = 25 EXAM: OBSTETRIC <14 WK [place] AND TRANSVAGINAL OB US TECHNIQUE: Both transabdominal and transvaginal ultrasound examinations were performed for complete evaluation of the gestation as well as the maternal uterus, adnexal regions, and pelvic cul-de-sac. Transvaginal technique was performed to assess early pregnancy. COMPARISON:  None for this gestation FINDINGS: Intrauterine gestational sac: None identified Yolk sac:  N/A Embryo:  N/A Cardiac Activity: N/A Heart Rate: N/A  bpm Subchorionic hemorrhage:  N/A Maternal uterus/adnexae: Uterus retroverted. Normal uterine morphology. No focal uterine mass. Trophoblastic reaction of the endometrial complex without identification of a gestational sac or endometrial fluid/blood. RIGHT ovary normal size and morphology, 2.2 x 3.7 x 2.4 cm. LEFT ovary measures 4.0 x 5.0 x 4.2 cm and contains a simple cyst measuring 4.8 x 3.4 x 3.4 cm. Trace free pelvic fluid. No other adnexal masses. IMPRESSION: No intrauterine gestation identified. Findings are compatible with pregnancy of unknown location. Differential diagnosis includes early intrauterine pregnancy too early to visualize, spontaneous abortion, and ectopic pregnancy. Serial quantitative beta hCG and or followup ultrasound recommended to definitively exclude ectopic pregnancy. Simple appearing LEFT  ovarian cyst 4.8 cm greatest diameter. Electronically Signed   By: Ulyses Southward M.D.   On: 08/27/2017 16:29   US Ob Transvaginal  Result Date: 08/27/2017 CLINICAL DATA:  Vaginal bleeding in first trimester of pregnancy; 8 weeks 5 days EGA by LMP; quantitative beta hCG = 25 EXAM: OBSTETRIC <14 WK [place] AND TRANSVAGINAL OB US TECHNIQUE: Both transabdominal and transvaginal ultrasound examinations were performed for complete evaluation of the gestation as well as the maternal uterus, adnexal regions, and pelvic cul-de-sac. Transvaginal technique was performed to assess early pregnancy. COMPARISON:  None for this gestation FINDINGS: Intrauterine gestational sac: None identified Yolk sac:  N/A Embryo:  N/A Cardiac Activity: N/A Heart Rate: N/A  bpm Subchorionic hemorrhage:  N/A Maternal uterus/adnexae: Uterus retroverted. Normal uterine morphology. No focal uterine mass. Trophoblastic reaction of the endometrial complex without identification of a gestational sac or endometrial fluid/blood.  RIGHT ovary normal size and morphology, 2.2 x 3.7 x 2.4 cm. LEFT ovary measures 4.0 x 5.0 x 4.2 cm and contains a simple cyst measuring 4.8 x 3.4 x 3.4 cm. Trace free pelvic fluid. No other adnexal masses. IMPRESSION: No intrauterine gestation identified. Findings are compatible with pregnancy of unknown location. Differential diagnosis includes early intrauterine pregnancy too early to visualize, spontaneous abortion, and ectopic pregnancy. Serial quantitative beta hCG and or followup ultrasound recommended to definitively exclude ectopic pregnancy. Simple appearing LEFT ovarian cyst 4.8 cm greatest diameter. Electronically Signed   By: Ulyses Southward M.D.   On: 08/27/2017 16:29    MAU Course  Procedures  MDM Patient decelines GC and wet prep today. [facility] and she does not currently have insurance. She does not feel the need to pay for them 2x.   Assessment and Plan    1. Pregnancy, location unknown   2. Vaginal bleeding in pregnancy, first trimester    DC home 1st Trimester precautions  Bleeding precautions Ectopic precautions RX: none  Return to MAU as needed 48 hours in clinic for repeat HCG.   Follow-up Information    [facility].   Specialty:  Obstetrics and Gynecology Why:  Friday 08/30/17 at 8:30  Contact information: [address] [place] [address] [phone]           Thressa Sheller 08/27/2017, 1:38 PM

## 2017-08-30 ENCOUNTER — Ambulatory Visit: Payer: Self-pay | Admitting: *Deleted

## 2017-08-30 DIAGNOSIS — O039 Complete or unspecified spontaneous abortion without complication: Secondary | ICD-10-CM

## 2017-08-30 NOTE — Progress Notes (Addendum)
Here for stat bhcg. States bled a lot on 08/27/17 . States has had heavy bleeding since then, but slowed down today to less than regular period. States not having pain , but still having pressure - pelvic. Patient declines stat bhcg, states she knows she had a miscarriage and doesn't want cost of stat bhcg. Discussed with Dr. Erin FullingHarraway-Smith and she states per patient refusing stat bhcg can do bhcg routine today and in 2 weeks. Explained to patient and patient in agreement with bhcg today and in 2 weeeks  If needed. Is completing sign up for Mychart so she can see results.  [facility]   Attestation of Attending Supervision of RN: Evaluation and management procedures were performed by the nurse under my supervision and collaboration.  I have reviewed the nursing note and chart, and I agree with the management and plan.  Carolyn L. Harraway-Smith, M.D., [facility] CoreFACOG

## 2017-08-31 LAB — BETA HCG QUANT (REF LAB): HCG QUANT: 7 m[IU]/mL

## 2017-09-05 ENCOUNTER — Ambulatory Visit (HOSPITAL_COMMUNITY): Payer: Self-pay

## 2018-06-26 ENCOUNTER — Encounter (HOSPITAL_COMMUNITY): Payer: Self-pay

## 2018-10-14 ENCOUNTER — Encounter (HOSPITAL_COMMUNITY): Payer: Self-pay | Admitting: Emergency Medicine

## 2018-10-14 ENCOUNTER — Emergency Department (HOSPITAL_COMMUNITY)
Admission: EM | Admit: 2018-10-14 | Discharge: 2018-10-14 | Disposition: A | Discharge status: Home / Self Care | Payer: Medicaid Other | Attending: Emergency Medicine | Admitting: Emergency Medicine

## 2018-10-14 ENCOUNTER — Other Ambulatory Visit: Payer: Self-pay

## 2018-10-14 DIAGNOSIS — N644 Mastodynia: Secondary | ICD-10-CM

## 2018-10-14 NOTE — Discharge Instructions (Addendum)
Return for any problem.  Follow-up with your regular care provider as instructed.  If you fail to have a period within the next week please recheck your pregnancy test at home.  It is very important that you follow-up with OB/GYN for further work-up of your reported breast tenderness.  Further work-up may include imaging studies such as ultrasound.  Failure to follow-up appropriately may put you at higher risk for a poor outcome if you have a significant problem with the breast.

## 2018-10-14 NOTE — ED Provider Notes (Signed)
[facility]-EMERGENCY DEPT Provider Note   CSN: [id number] Arrival date & time: 10/14/18  0746    History   Chief Complaint Chief Complaint  Patient presents with  . Breast Pain    HPI Chloe Armstrong is a 33 y.o. female.     33 year old female with prior medical history as detailed below presents for evaluation of right-sided breast discomfort.  Patient reports approximately 1 week of mild tenderness to the lateral aspect of the right breast.  She also reports that her period is approximately 1 week late.  She is already done a home pregnancy test over the weekend that was negative.  She declines repeat pregnancy testing today.  He does not have established primary care.  She denies feeling a lump or mass in the right breast.  She denies fever.  She denies any drainage from the right breast.  The history is provided by the patient and medical records.  Illness  Location:  Right breast pain Severity:  Mild Onset quality:  Gradual Duration:  1 week Timing:  Rare Progression:  Waxing and waning Chronicity:  New   Past Medical History:  Diagnosis Date  . Headache    Chronic migraines    Patient Active Problem List   Diagnosis Date Noted  . Indication for care in labor or delivery 01/27/2015  . Postpartum state 01/27/2015    Past Surgical History:  Procedure Laterality Date  . WISDOM TOOTH EXTRACTION       OB History    Gravida  3   Para  2   Term  2   Preterm      AB      Living  2     SAB      TAB      Ectopic      Multiple  0   Live Births  2            Home Medications    Prior to Admission medications   Medication Sig Start Date End Date Taking? Authorizing Provider  acetaminophen (TYLENOL) 500 MG tablet Take 500 mg by mouth every 6 (six) hours as needed for mild pain or headache.    [provider]    Family History No family history on file.  Social History Social History   Tobacco Use  .  Smoking status: Never Smoker  . Smokeless tobacco: Never Used  Substance Use Topics  . Alcohol use: No  . Drug use: No     Allergies   Tramadol   Review of Systems Review of Systems  All other systems reviewed and are negative.    Physical Exam Updated Vital Signs BP (!) 149/103 (BP Location: Left Arm)   Pulse 91   Temp 98.1 F (36.7 C) (Oral)   Resp 18   Ht 5\' 6"  (1.676 m)   Wt 100.5 kg   LMP 09/07/2018   SpO2 100%   BMI 35.76 kg/m   Physical Exam Vitals signs and nursing note reviewed.  Constitutional:      General: She is not in acute distress.    Appearance: Normal appearance. She is well-developed.  HENT:     Head: Normocephalic and atraumatic.  Eyes:     Conjunctiva/sclera: Conjunctivae normal.     Pupils: Pupils are equal, round, and reactive to light.  Neck:     Musculoskeletal: Normal range of motion and neck supple.  Cardiovascular:     Rate and Rhythm: Normal rate and regular  rhythm.     Heart sounds: Normal heart sounds.  Pulmonary:     Effort: Pulmonary effort is normal. No respiratory distress.     Breath sounds: Normal breath sounds.  Abdominal:     General: There is no distension.     Palpations: Abdomen is soft.     Tenderness: There is no abdominal tenderness.  Musculoskeletal: Normal range of motion.        General: No deformity.     Comments: Mild tenderness noted to the lateral aspect of the right breast.  No discrete mass palpated.  No overlying erythema or skin change noted.  No drainage or discharge noted from the nipple.  Patient's nipple is pierced.  There is no signs of infection of the piercing itself.  Skin:    General: Skin is warm and dry.  Neurological:     Mental Status: She is alert and oriented to person, place, and time.      ED Treatments / Results  Labs (all labs ordered are listed, but only abnormal results are displayed) Labs Reviewed - No data to display  EKG None  Radiology No results  found.  Procedures Procedures (including critical care time)  Medications Ordered in ED Medications - No data to display   Initial Impression / Assessment and Plan / ED Course  I have reviewed the triage vital signs and the nursing notes.  Pertinent labs & imaging results that were available during my care of the patient were reviewed by me and considered in my medical decision making (see chart for details).        MDM  Screen complete  Patient is presenting for evaluation of reported right-sided breast pain.  History and exam is not strongly suggestive of significant acute pathology. However, patient is strongly advised to FU closely with [facility] for further evaluation and possible ultrasound.   Repeatedly declined pregnancy testing today.  Patient does understand need for close follow-up.  Strict return precautions given and understood. Final Clinical Impressions(s) / ED Diagnoses   Final diagnoses:  Breast tenderness in female    ED Discharge Orders    None       Wynetta Fines, MD 10/14/18 [phone]

## 2018-10-14 NOTE — ED Triage Notes (Signed)
Per pt, states right breast pain for a week or so-states she does not know if she is pregnant or not-LMP 09/07/18-states she took a home test and it was negative

## 2018-10-18 ENCOUNTER — Emergency Department (HOSPITAL_COMMUNITY)
Admission: EM | Admit: 2018-10-18 | Discharge: 2018-10-18 | Disposition: A | Discharge status: Home / Self Care | Payer: Medicaid Other | Attending: Emergency Medicine | Admitting: Emergency Medicine

## 2018-10-18 ENCOUNTER — Other Ambulatory Visit: Payer: Self-pay

## 2018-10-18 ENCOUNTER — Encounter (HOSPITAL_COMMUNITY): Payer: Self-pay | Admitting: Emergency Medicine

## 2018-10-18 DIAGNOSIS — N644 Mastodynia: Secondary | ICD-10-CM | POA: Insufficient documentation

## 2018-10-18 NOTE — ED Provider Notes (Signed)
[facility] EMERGENCY DEPARTMENT Provider Note   CSN: [id number] Arrival date & time: 10/18/18  9833    History   Chief Complaint No chief complaint on file.   HPI Chloe Armstrong is a 33 y.o. female.     33 y.o female with no PMH presents to the ED with a chief complaint of right breast pain. Patient reports she was seen in the ED 4 days ago for the same complaint, reports she was told to follow-up with the [facility], called ahead and was told she could not be seen there as she was currently not pregnant.  Patient reports pain along the right breast which began 5 days ago, she reports her breast is painful to the touch but feels better at rest.  She has not tried any medical therapy for relieving symptoms.  Patient also told triage she had some chest pain but states last night she woke up and felt right breast pain which felt uncomfortable but was able to go back to bed.  She thought this was due to changes in her menstrual cycle, states she had her period on Wednesday.  Patient reports she is currently on her period in it regular.  She also had a negative pregnancy test at home.  She denies any shortness of breath, nipple discharge, changes in the skin.     Past Medical History:  Diagnosis Date  . Headache    Chronic migraines    Patient Active Problem List   Diagnosis Date Noted  . Indication for care in labor or delivery 01/27/2015  . Postpartum state 01/27/2015    Past Surgical History:  Procedure Laterality Date  . WISDOM TOOTH EXTRACTION       OB History    Gravida  3   Para  2   Term  2   Preterm      AB      Living  2     SAB      TAB      Ectopic      Multiple  0   Live Births  2            Home Medications    Prior to Admission medications   Medication Sig Start Date End Date Taking? Authorizing Provider  acetaminophen (TYLENOL) 500 MG tablet Take 500 mg by mouth every 6 (six) hours as needed for mild pain or  headache.    [provider]    Family History No family history on file.  Social History Social History   Tobacco Use  . Smoking status: Never Smoker  . Smokeless tobacco: Never Used  Substance Use Topics  . Alcohol use: No  . Drug use: No     Allergies   Tramadol   Review of Systems Review of Systems  Constitutional: Negative for chills and fever.  HENT: Negative for ear pain and sore throat.   Eyes: Negative for pain and visual disturbance.  Respiratory: Negative for cough and shortness of breath.   Cardiovascular: Negative for chest pain and palpitations.  Gastrointestinal: Negative for abdominal pain and vomiting.  Genitourinary: Negative for dysuria and hematuria.  Musculoskeletal: Negative for arthralgias and back pain.  Skin: Negative for color change and rash.  Neurological: Negative for seizures and syncope.  All other systems reviewed and are negative.    Physical Exam Updated Vital Signs BP (!) 134/96 (BP Location: Right Arm)   Pulse 80   Temp 98.1 F (36.7 C) (  Oral)   Resp 18   Ht  (1.676 m)   Wt 100.5 kg   SpO2 100%   BMI 35.76 kg/m   Physical Exam Vitals signs and nursing note reviewed.  Constitutional:      General: She is not in acute distress.    Appearance: She is well-developed.  HENT:     Head: Normocephalic and atraumatic.     Mouth/Throat:     Pharynx: No oropharyngeal exudate.  Eyes:     Pupils: Pupils are equal, round, and reactive to light.  Neck:     Musculoskeletal: Normal range of motion.  Cardiovascular:     Rate and Rhythm: Regular rhythm.     Heart sounds: Normal heart sounds.  Pulmonary:     Effort: Pulmonary effort is normal. No respiratory distress.     Breath sounds: Normal breath sounds.  Chest:     Breasts:        Right: Normal. No inverted nipple, mass, nipple discharge, skin change or tenderness.        Left: Normal.       Comments: Right breast appears nonerythematous, no edema, no skin  changes, no nipple discharge or tenderness.  Pain around 7 o'clock with deep palpation. Abdominal:     General: Bowel sounds are normal. There is no distension.     Palpations: Abdomen is soft.     Tenderness: There is no abdominal tenderness.  Musculoskeletal:        General: No tenderness or deformity.     Right lower leg: No edema.     Left lower leg: No edema.  Lymphadenopathy:     Upper Body:     Right upper body: No axillary or pectoral adenopathy.     Left upper body: No axillary or pectoral adenopathy.  Skin:    General: Skin is warm and dry.  Neurological:     Mental Status: She is alert and oriented to person, place, and time.      ED Treatments / Results  Labs (all labs ordered are listed, but only abnormal results are displayed) Labs Reviewed - No data to display  EKG EKG Interpretation  Date/Time:  Saturday October 18 2018 08:40:44 EST Ventricular Rate:  87 PR Interval:    QRS Duration: 89 QT Interval:  390 QTC Calculation: 470 R Axis:   66 Text Interpretation:  Sinus rhythm Borderline T abnormalities, anterior leads Baseline wander  nonspecific t wave changes are new since last tracing Confirmed by Linwood Dibbles [phone]) on 10/18/2018 8:54:29 AM   Radiology No results found.  Procedures Procedures (including critical care time)  Medications Ordered in ED Medications - No data to display   Initial Impression / Assessment and Plan / ED Course  I have reviewed the triage vital signs and the nursing notes.  Pertinent labs & imaging results that were available during my care of the patient were reviewed by me and considered in my medical decision making (see chart for details).       Patient presents with right breast pain, seen in the ED 4 days ago and instructed to follow-up with women's clinic, reports she was told she cannot be seen there as she is currently not pregnant and on her menstrual cycle.  Reports pain with deep palpation of her right breast,  reports pain when sleeping yesterday which was relieved as she went back to bed.  Has not tried any medical therapy for relieving symptoms.  During evaluation there were no  changes in the skin, no nipple retraction, no nipple discharge, nonerythematous,low suspicion for cellulitis or abscess, non fluctuant. Patient is instructed to make an appointment with the  and wellness clinic, she reports arriving there today but they are close.  Encouraged to go back on Monday to schedule an appointment with the primary care physician who can give her a referral for a right breast ultrasound or mammogram.  Patient displeased with answer but reports she will schedule an appointment.  With stable vital signs, afebrile discharge from the ED with steady gait.  Final Clinical Impressions(s) / ED Diagnoses   Final diagnoses:  Breast pain, right    ED Discharge Orders    None       Claude Manges, PA-C 10/18/18 5003    Linwood Dibbles, MD 10/19/18 [phone]

## 2018-10-18 NOTE — Discharge Instructions (Addendum)
I am sorry we were unable to help you today, I have provided the number to 2 facilities please schedule an appointment with either of these to have a mammogram or ultrasound schedule of your breast.  If pain persist you may take Tylenol or ibuprofen for relieving symptoms.  If you experience any fever, nipple discharge, changes in the skin of your breast please return to the ED.

## 2018-10-18 NOTE — ED Notes (Signed)
Patient given discharge instructions and verbalized understanding.  Patient stable to discharge at this time.  Patient is alert and oriented to baseline.  No distressed noted at this time.  All belongings taken with the patient at discharge.   

## 2018-10-18 NOTE — ED Triage Notes (Signed)
Pt complains of chest pain and right sided breast pain since last Friday.

## 2018-10-21 ENCOUNTER — Other Ambulatory Visit: Payer: Self-pay | Admitting: Obstetrics and Gynecology

## 2018-10-21 ENCOUNTER — Ambulatory Visit: Payer: Self-pay | Admitting: Physician Assistant

## 2018-10-21 DIAGNOSIS — N644 Mastodynia: Secondary | ICD-10-CM

## 2018-10-24 ENCOUNTER — Ambulatory Visit
Admission: RE | Admit: 2018-10-24 | Discharge: 2018-10-24 | Disposition: A | Discharge status: Home / Self Care | Payer: No Typology Code available for payment source | Source: Ambulatory Visit | Attending: Obstetrics and Gynecology | Admitting: Obstetrics and Gynecology

## 2018-10-24 ENCOUNTER — Other Ambulatory Visit: Payer: Self-pay

## 2018-10-24 DIAGNOSIS — N644 Mastodynia: Secondary | ICD-10-CM

## 2018-11-23 ENCOUNTER — Other Ambulatory Visit: Payer: Self-pay

## 2018-11-23 ENCOUNTER — Encounter (HOSPITAL_COMMUNITY): Payer: Self-pay | Admitting: Emergency Medicine

## 2018-11-23 ENCOUNTER — Emergency Department (HOSPITAL_COMMUNITY)
Admission: EM | Admit: 2018-11-23 | Discharge: 2018-11-23 | Disposition: A | Discharge status: Home / Self Care | Payer: Medicaid Other | Attending: Emergency Medicine | Admitting: Emergency Medicine

## 2018-11-23 DIAGNOSIS — N644 Mastodynia: Secondary | ICD-10-CM | POA: Insufficient documentation

## 2018-11-23 DIAGNOSIS — R6884 Jaw pain: Secondary | ICD-10-CM | POA: Insufficient documentation

## 2018-11-23 MED ORDER — NAPROXEN 500 MG PO TABS: 500.0000 mg | ORAL_TABLET | Freq: Two times a day (BID) | ORAL | 0 refills | Status: AC

## 2018-11-23 NOTE — ED Provider Notes (Signed)
[facility] Emergency Department Provider Note MRN:  [id number]  Arrival date & time: 11/23/18     Chief Complaint   Jaw Pain   History of Present Illness   Chloe Armstrong is a [age] y.o. year-old female with a history of migraines presenting to the ED with chief complaint of jaw pain.  2 to 3 days of mild bilateral jaw pain, associated with inability to fully open the jaw.  Denies trouble breathing, no fever, no recent wounds, no trauma.  Also endorsing continued right breast pain, which she explains was evaluated by the breast specialist a few weeks ago.  Denies chest pain or shortness of breath, no abdominal pain, no numbness weakness to the arms or legs.  Symptoms are constant, worse with palpation or attempting to open the jaw.  Review of Systems  A complete 10 system review of systems was obtained and all systems are negative except as noted in the HPI and PMH.   Patient's Health History    Past Medical History:  Diagnosis Date  . Headache    Chronic migraines    Past Surgical History:  Procedure Laterality Date  . HERNIA REPAIR    . WISDOM TOOTH EXTRACTION      Family History  Problem Relation Age of Onset  . Breast cancer Neg Hx     Social History   Socioeconomic History  . Marital status: Single    Spouse name: Not on file  . Number of children: Not on file  . Years of education: Not on file  . Highest education level: Not on file  Occupational History  . Not on file  Social Needs  . Financial resource strain: Not on file  . Food insecurity:    Worry: Not on file    Inability: Not on file  . Transportation needs:    Medical: Not on file    Non-medical: Not on file  Tobacco Use  . Smoking status: Never Smoker  . Smokeless tobacco: Never Used  Substance and Sexual Activity  . Alcohol use: No  . Drug use: No  . Sexual activity: Not Currently  Lifestyle  . Physical activity:    Days per week: Not on file    Minutes per session:  Not on file  . Stress: Not on file  Relationships  . Social connections:    Talks on phone: Not on file    Gets together: Not on file    Attends religious service: Not on file    Active member of club or organization: Not on file    Attends meetings of clubs or organizations: Not on file    Relationship status: Not on file  . Intimate partner violence:    Fear of current or ex partner: Not on file    Emotionally abused: Not on file    Physically abused: Not on file    Forced sexual activity: Not on file  Other Topics Concern  . Not on file  Social History Narrative  . Not on file     Physical Exam  Vital Signs and Nursing Notes reviewed Vitals:   11/23/18 1011 11/23/18 1023  BP: (!) 140/93   Pulse: 81   Resp: 16   Temp: 98.1 F (36.7 C)   SpO2: 100% 100%    CONSTITUTIONAL: Well-appearing, NAD NEURO:  Alert and oriented x 3, no focal deficits EYES:  eyes equal and reactive ENT/NECK:  no LAD, no JVD; mild tenderness to palpation to bilateral  TMJ CARDIO: Regular rate, well-perfused, normal S1 and S2 PULM:  CTAB no wheezing or rhonchi GI/GU:  normal bowel sounds, non-distended, non-tender MSK/SPINE:  No gross deformities, no edema SKIN:  no rash, atraumatic PSYCH:  Appropriate speech and behavior  Diagnostic and Interventional Summary    Labs Reviewed - No data to display  No orders to display    Medications - No data to display   Procedures Critical Care  ED Course and Medical Decision Making  I have reviewed the triage vital signs and the nursing notes.  Pertinent labs & imaging results that were available during my care of the patient were reviewed by me and considered in my medical decision making (see below for details).  Patient is with normal vital signs, afebrile, well-appearing, mild trismus but without significant pain, no airway concerns.  Inconsistent exam, states that she cannot open her mouth fully but opens her mouth fairly wide when conversing.   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  No fever, no shortness of breath, no recent wounds to suggest tetanus, patient is fully vaccinated.  Dentition appears normal.  No trauma, no indication for imaging today.  Will treat with conservative management, NSAIDs, strict return precautions.  Patient also with right breast tenderness and a nodule, examined here with female nurse chaperone, not fluctuant, no erythema, seems consistent with mammogram findings of fibroglandular tissue.  Advised to follow-up with the breast specialist again if she has continued concerns.  After the discussed management above, the patient was determined to be safe for discharge.  The patient was in agreement with this plan and all questions regarding their care were answered.  ED return precautions were discussed and the patient will return to the ED with any significant worsening of condition.  Elmer Sow. Pilar Plate, MD [facility] Emergency Medicine [facility] [email or web address]  Final Clinical Impressions(s) / ED Diagnoses     ICD-10-CM   1. Jaw pain R68.84   2. Breast pain N64.4     ED Discharge Orders         Ordered    naproxen (NAPROSYN) 500 MG tablet  2 times daily     11/23/18 1044             Sabas Sous, MD 11/23/18 1050

## 2018-11-23 NOTE — Discharge Instructions (Signed)
You were evaluated in the Emergency Department and after careful evaluation, we did not find any emergent condition requiring admission or further testing in the hospital.  Your symptoms today seem to be due to inflammation of the joints of the jaw.  We are providing you with a strong anti-inflammatory for the symptoms.  Please do not take if pregnant.  Follow up with the [facility]'s center regarding the continued pain in your breasts.  Please return to the Emergency Department if you experience any worsening of your condition such as fever or trouble breathing.  We encourage you to follow up with a primary care provider.  Thank you for allowing Chloe Armstrong to be a part of your care.

## 2018-11-23 NOTE — ED Triage Notes (Signed)
Patient arrived by self from home. Pt c/o neck pain that started on Thursday. Pt states she is now having problems opening up her jaw. Pt stated jaw feels as if it's stuck when trying to open.    Pt had teeth removed in 2013. Pt began having issues w/ jaw in 2013 but hasn't had issues since then.

## 2018-11-23 NOTE — ED Notes (Signed)
Patient given discharge teaching and verbalized understanding. Patient ambulated out of ED with a steady gait. 

## 2018-11-24 ENCOUNTER — Other Ambulatory Visit (HOSPITAL_COMMUNITY): Payer: Self-pay | Admitting: *Deleted

## 2018-11-24 DIAGNOSIS — N6310 Unspecified lump in the right breast, unspecified quadrant: Secondary | ICD-10-CM

## 2018-11-25 ENCOUNTER — Other Ambulatory Visit (HOSPITAL_COMMUNITY): Payer: Self-pay | Admitting: *Deleted

## 2018-12-03 ENCOUNTER — Telehealth (HOSPITAL_COMMUNITY): Payer: Self-pay | Admitting: *Deleted

## 2018-12-03 NOTE — Telephone Encounter (Signed)
Telephoned patient at home number and left message to return call to [facility]. Advised would need to speak to patient prior to appointment scheduled for April 23 otherwise would have to cancel appointment.

## 2018-12-04 ENCOUNTER — Other Ambulatory Visit (HOSPITAL_COMMUNITY): Payer: Self-pay | Admitting: *Deleted

## 2018-12-04 ENCOUNTER — Ambulatory Visit (HOSPITAL_COMMUNITY)
Admission: RE | Admit: 2018-12-04 | Discharge: 2018-12-04 | Disposition: A | Discharge status: Home / Self Care | Payer: No Typology Code available for payment source | Source: Ambulatory Visit | Attending: Obstetrics and Gynecology | Admitting: Obstetrics and Gynecology

## 2018-12-04 ENCOUNTER — Other Ambulatory Visit: Payer: Self-pay

## 2018-12-04 ENCOUNTER — Encounter (HOSPITAL_COMMUNITY): Payer: Self-pay

## 2018-12-04 VITALS — BP 116/78 | Temp 99.0°F | Ht 66.0 in | Wt 249.0 lb

## 2018-12-04 DIAGNOSIS — N898 Other specified noninflammatory disorders of vagina: Secondary | ICD-10-CM

## 2018-12-04 DIAGNOSIS — N6313 Unspecified lump in the right breast, lower outer quadrant: Secondary | ICD-10-CM

## 2018-12-04 DIAGNOSIS — Z01419 Encounter for gynecological examination (general) (routine) without abnormal findings: Secondary | ICD-10-CM

## 2018-12-04 NOTE — Patient Instructions (Signed)
Explained breast self awareness with [name]. Let patient know [facility] will cover Pap smears and HPV typing every 5 years unless has a history of abnormal Pap smears. Referred patient to [facility] for a right breast biopsy per recommendation. Appointment scheduled for Tuesday, December 09, 2018 at 1000. Patient aware of appointment and will be there. Let patient know will follow up with her within the next week with results of Pap smear and wet prep by phone. Chloe Armstrong verbalized understanding.  Bibiana Gillean, Kathaleen Maser, RN 1:45 PM

## 2018-12-04 NOTE — Progress Notes (Signed)
Complaints of right breast lump x 2-3 weeks that is painful. Patient states the pain is constant and started two weeks ago. Patient rates the pain at a 10 out of 10. Patient referred to [facility] by [facility] due to recommending a right breast biopsy. Per patient had a right breast ultrasound completed 12/02/2018.  Pap Smear: Pap smear completed today. Last Pap smear was 07/21/2014 at [facility] and normal. Per patient has no history of an abnormal Pap smear. Last Pap smear result is in Epic.  Physical exam: Breasts Breasts symmetrical. No skin abnormalities bilateral breasts. No nipple retraction bilateral breasts. No nipple discharge bilateral breasts. No lymphadenopathy. No lumps palpated left breast. Palpated a lump within the right breast at 8 o'clock 7 cm from the nipple. Complaints of diffuse right breast pain on exam that was greater within the outer breast. Referred patient to [facility] for a right breast biopsy per recommendation. Appointment scheduled for Tuesday, December 09, 2018 at 1000.        Pelvic/Bimanual   Ext Genitalia No lesions, no swelling and no discharge observed on external genitalia.         Vagina Vagina pink and normal texture. No lesions and thick white discharge observed in vagina. Positive whiff test. Wet prep completed.          Cervix Cervix is present. Cervix pink and of normal texture. Thick white discharge observed on cervix.    Uterus Uterus is present and palpable. Uterus in normal position and normal size.        Adnexae Bilateral ovaries present and palpable. No tenderness on palpation.         Rectovaginal No rectal exam completed today since patient had no rectal complaints. No skin abnormalities observed on exam.    Smoking History: Patient has never smoked.  Patient Navigation: Patient education provided. Access to services provided for patient through [facility] program.   Breast and Cervical Cancer Risk  Assessment: Patient has no family history of breast cancer, known genetic mutations, or radiation treatment to the chest before age 47. Patient has no history of cervical dysplasia, immunocompromised, or DES exposure in-utero.  Risk Assessment    Risk Scores      12/04/2018   Last edited by: Lynnell Dike, LPN   5-year risk:    Lifetime risk:

## 2018-12-05 LAB — CERVICOVAGINAL ANCILLARY ONLY
Bacterial vaginitis: POSITIVE — AB
Candida vaginitis: NEGATIVE
Trichomonas: NEGATIVE

## 2018-12-08 ENCOUNTER — Other Ambulatory Visit: Payer: Self-pay | Admitting: Obstetrics and Gynecology

## 2018-12-08 MED ORDER — METRONIDAZOLE 500 MG PO TABS: 500.0000 mg | ORAL_TABLET | Freq: Two times a day (BID) | ORAL | 0 refills | Status: AC

## 2018-12-09 ENCOUNTER — Other Ambulatory Visit: Payer: Self-pay | Admitting: Radiology

## 2018-12-09 LAB — CYTOLOGY - PAP
Diagnosis: UNDETERMINED — AB
HPV: NOT DETECTED

## 2018-12-10 ENCOUNTER — Telehealth (HOSPITAL_COMMUNITY): Payer: Self-pay | Admitting: *Deleted

## 2018-12-10 NOTE — Telephone Encounter (Signed)
Telephoned patient at home number and left message to return call to [facility]. Need to advised patient of abnormal pap smear results and wet prep results.

## 2018-12-10 NOTE — Addendum Note (Signed)
Encounter addended by: Lynnell Dike, LPN on: 12/07/621 8:36 AM  Actions taken: Order Reconciliation Section accessed

## 2018-12-10 NOTE — Telephone Encounter (Signed)
Patient returned call to [facility]. Advised patient of abnormal pap smear but HVP was negative. Next pap smear due in one year. Advised patient pap smear did show bacterial vaginosis. Advised patient medication was called into pharmacy. Advised to make sure to finish all medication and no alcohol while talking medication. Patient voiced understanding.

## 2018-12-16 ENCOUNTER — Ambulatory Visit (HOSPITAL_COMMUNITY): Payer: No Typology Code available for payment source

## 2018-12-16 ENCOUNTER — Other Ambulatory Visit: Payer: Self-pay | Admitting: Radiology

## 2018-12-18 ENCOUNTER — Other Ambulatory Visit: Payer: No Typology Code available for payment source

## 2018-12-22 ENCOUNTER — Other Ambulatory Visit: Payer: No Typology Code available for payment source

## 2018-12-29 ENCOUNTER — Encounter (HOSPITAL_COMMUNITY): Payer: Self-pay | Admitting: *Deleted

## 2019-11-04 IMAGING — MG DIGITAL DIAGNOSTIC BILATERAL MAMMOGRAM WITH TOMO AND CAD
6 of 10 series · 6 of 30 positions shown · non-contrast
Comparison: None.

CLINICAL DATA: 32-year-old presenting with focal pain involving the
OUTER RIGHT breast which are has been constant for the past 2 weeks.
This is the patient's initial baseline mammogram. Patient states no
family history of breast cancer.

EXAM:
DIGITAL DIAGNOSTIC BILATERAL MAMMOGRAM WITH CAD AND TOMO
ULTRASOUND RIGHT BREAST

[L MLO synth-2D]
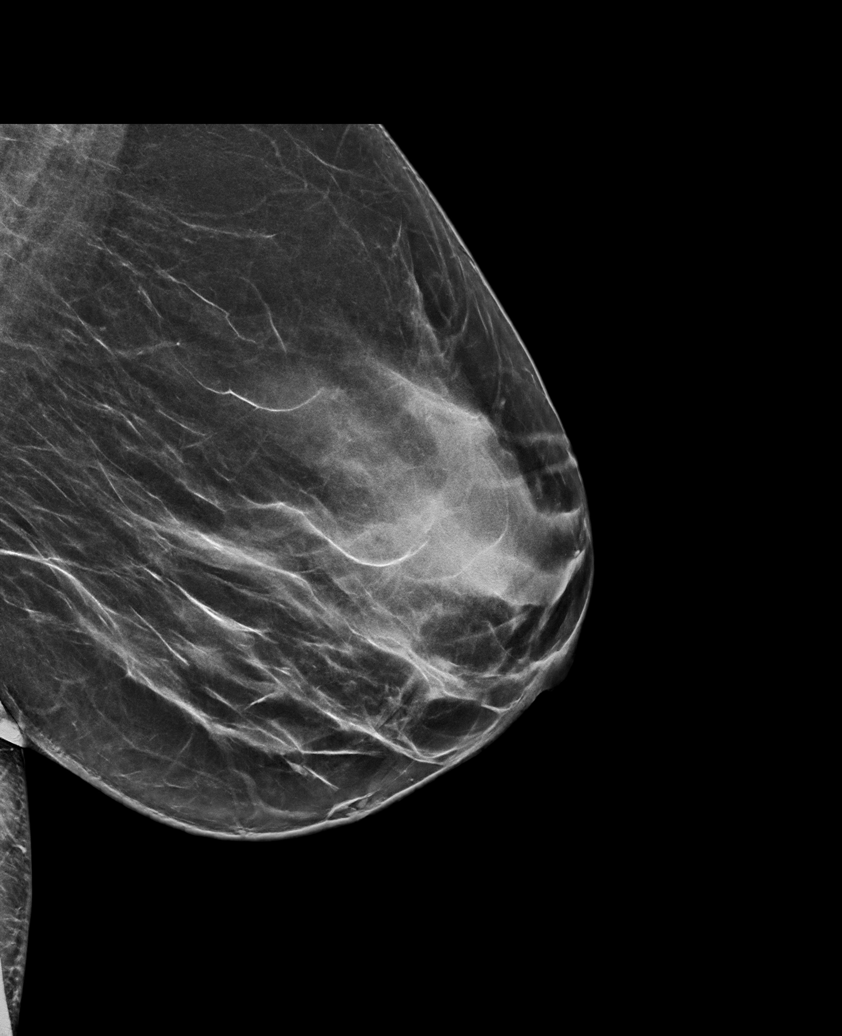

[R CC synth-2D]
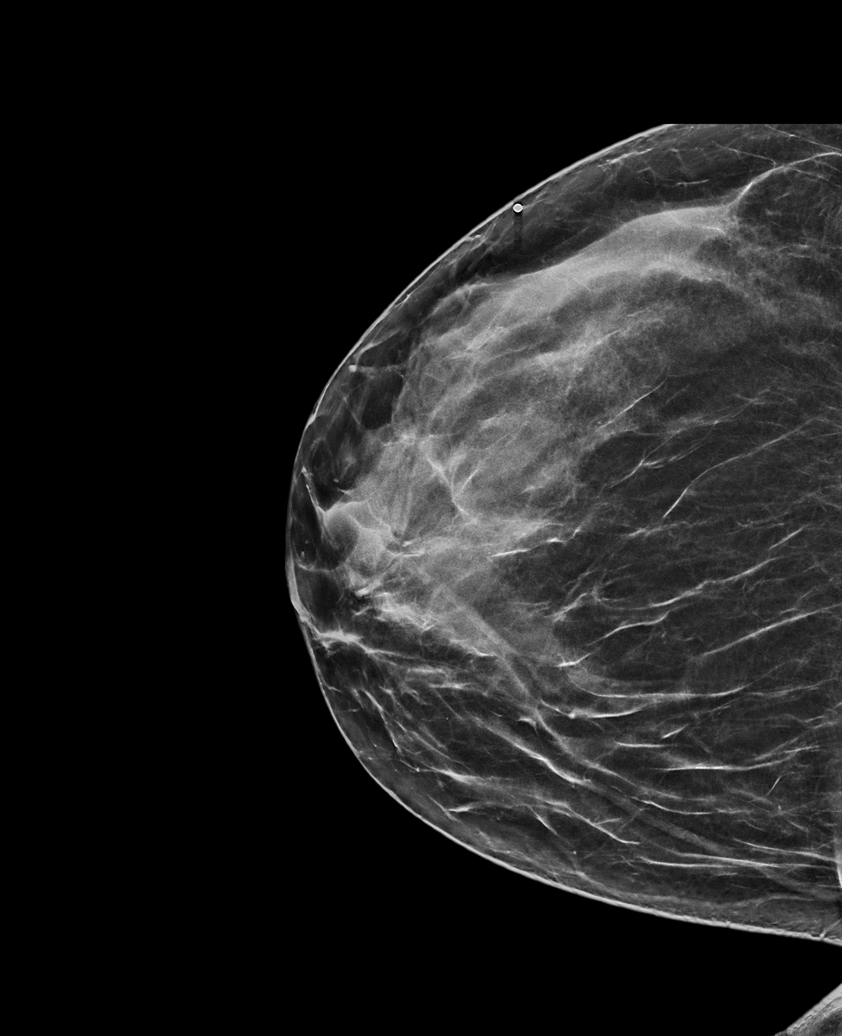

[L CC synth-2D]
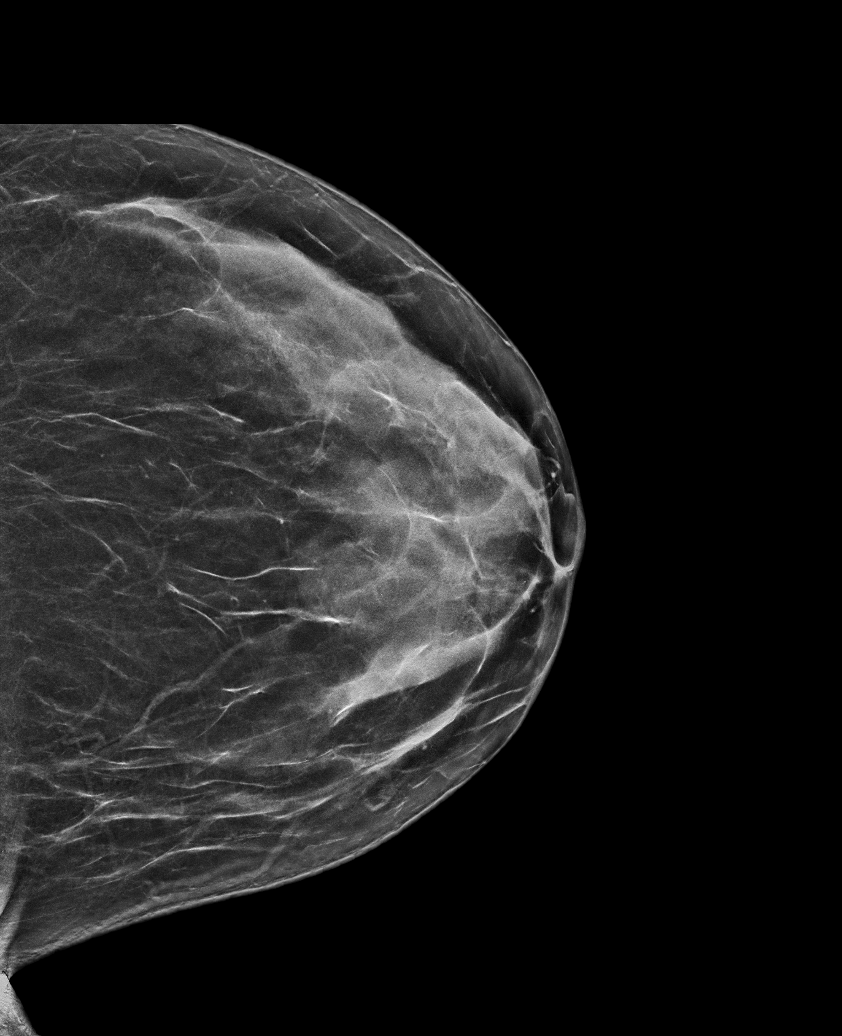

[R MLO synth-2D]
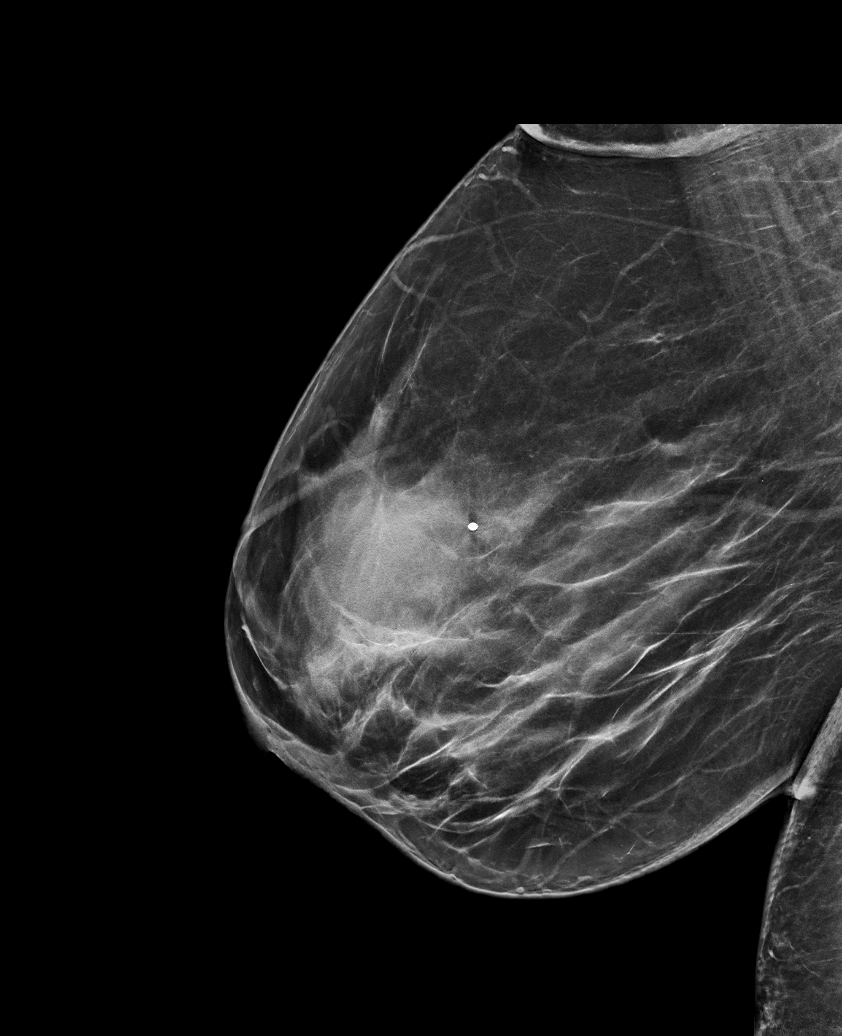

[R TAN synth-2D]
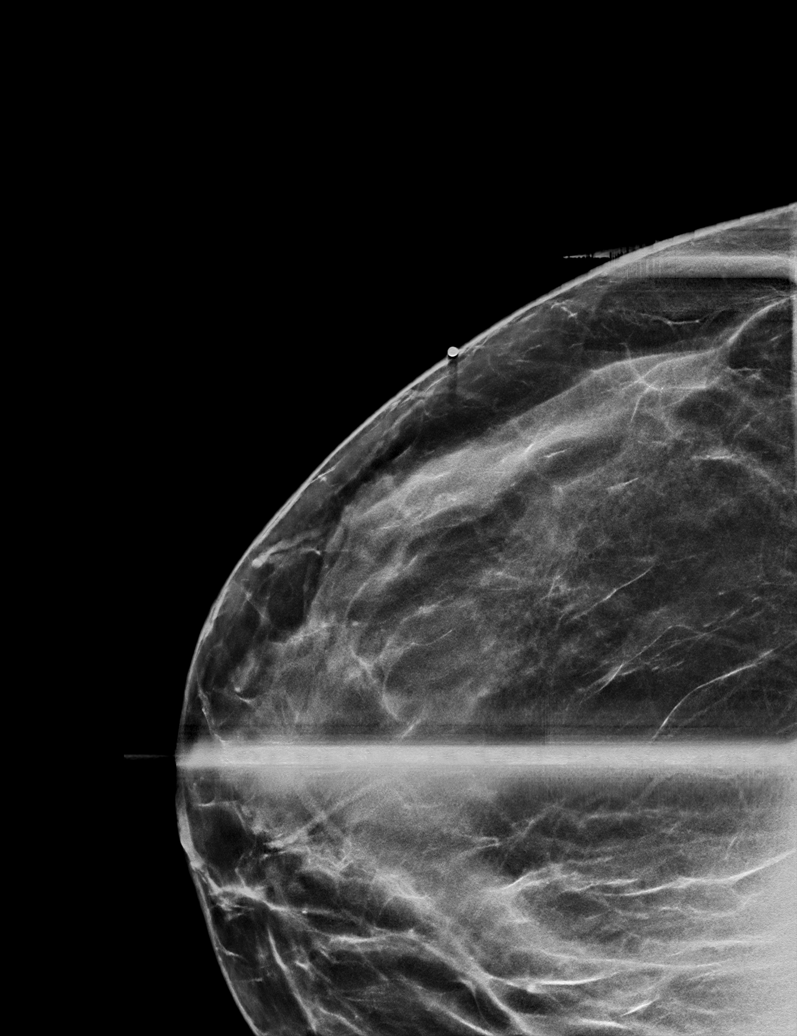

[L MLO tomo · tomo slice 38/75.0]
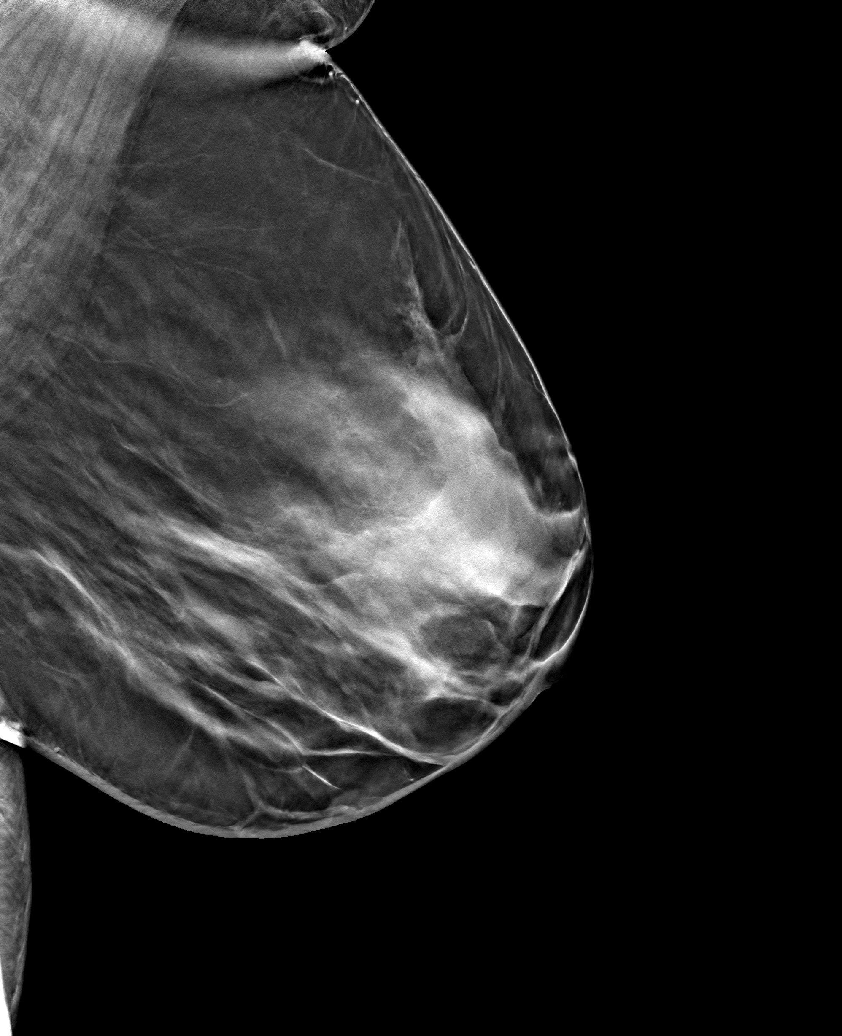

[6 of 30 positions shown; findings below may reference images not displayed]

ACR Breast Density Category d: The breast tissue is extremely dense,
which lowers the sensitivity of mammography.
FINDINGS: Tomosynthesis and synthesized full field CC and MLO views of both
breasts were obtained. Tomosynthesis and synthesized spot
compression tangential view of the area of concern in the RIGHT
breast was also obtained.

No mammographic abnormality in the area of focal pain in the OUTER
RIGHT breast. Dense normal fibroglandular tissue is present in this
location.

No findings suspicious for malignancy in either breast.

Mammographic images were processed with CAD.

On physical exam, there is no palpable abnormality in the OUTER
RIGHT breast. The patient describes mild tenderness to palpation.

Targeted RIGHT breast ultrasound is performed, showing normal dense
fibroglandular tissue throughout the OUTER breast. No cyst, solid
mass or abnormal acoustic shadowing is identified.
IMPRESSION: 1. No mammographic or sonographic evidence of malignancy involving
the RIGHT breast.
2. No mammographic evidence of malignancy involving the LEFT breast.

RECOMMENDATION:
Screening mammogram at age 41 unless there are persistent or
intervening clinical concerns. (Code:RG-0-85I)

Strategies for alleviating breast pain including decreasing caffeine
intake, vitamin-E supplementation, and avoidance of tight
compression brassieres, including underwire brassieres, were
discussed with the patient.

I have discussed the findings and recommendations with the patient.
Results were also provided in writing at the conclusion of the
visit. If applicable, a reminder letter will be sent to the patient
regarding the next appointment.

BI-RADS CATEGORY  1: Negative.

## 2023-02-19 ENCOUNTER — Other Ambulatory Visit: Payer: Self-pay

## 2023-02-20 ENCOUNTER — Other Ambulatory Visit: Payer: Self-pay | Admitting: Family Medicine

## 2023-02-20 DIAGNOSIS — R221 Localized swelling, mass and lump, neck: Secondary | ICD-10-CM

## 2023-02-20 LAB — COMPREHENSIVE METABOLIC PANEL
ALT: 12 IU/L (ref 0–32)
AST: 22 IU/L (ref 0–40)
Albumin: 4.3 g/dL (ref 3.9–4.9)
Alkaline Phosphatase: 67 IU/L (ref 44–121)
BUN/Creatinine Ratio: 7 — ABNORMAL LOW (ref 9–23)
BUN: 7 mg/dL (ref 6–20)
Bilirubin Total: 0.4 mg/dL (ref 0.0–1.2)
CO2: 28 mmol/L (ref 20–29)
Calcium: 8.9 mg/dL (ref 8.7–10.2)
Chloride: 105 mmol/L (ref 96–106)
Creatinine, Ser: 1 mg/dL (ref 0.57–1.00)
Globulin, Total: 3.2 g/dL (ref 1.5–4.5)
Glucose: 89 mg/dL (ref 70–99)
Potassium: 3.8 mmol/L (ref 3.5–5.2)
Sodium: 142 mmol/L (ref 134–144)
Total Protein: 7.5 g/dL (ref 6.0–8.5)
eGFR: 75 mL/min/{1.73_m2} (ref >59)

## 2023-02-20 LAB — CBC WITH DIFFERENTIAL/PLATELET
Basophils Absolute: 0 10*3/uL (ref 0.0–0.2)
Basos: 0 %
EOS (ABSOLUTE): 0 10*3/uL (ref 0.0–0.4)
Eos: 1 %
Hematocrit: 38.8 % (ref 34.0–46.6)
Hemoglobin: 13 g/dL (ref 11.1–15.9)
Immature Grans (Abs): 0 10*3/uL (ref 0.0–0.1)
Immature Granulocytes: 0 %
Lymphocytes Absolute: 1.2 10*3/uL (ref 0.7–3.1)
Lymphs: 34 %
MCH: 29.1 pg (ref 26.6–33.0)
MCHC: 33.5 g/dL (ref 31.5–35.7)
MCV: 87 fL (ref 79–97)
Monocytes Absolute: 0.4 10*3/uL (ref 0.1–0.9)
Monocytes: 12 %
Neutrophils Absolute: 1.8 10*3/uL (ref 1.4–7.0)
Neutrophils: 53 %
Platelets: 198 10*3/uL (ref 150–450)
RBC: 4.46 x10E6/uL (ref 3.77–5.28)
RDW: 14 % (ref 11.7–15.4)
WBC: 3.4 10*3/uL (ref 3.4–10.8)

## 2023-02-21 ENCOUNTER — Encounter: Payer: Self-pay | Admitting: Family Medicine

## 2023-02-21 ENCOUNTER — Other Ambulatory Visit: Payer: 59

## 2023-02-21 ENCOUNTER — Inpatient Hospital Stay: Admission: RE | Admit: 2023-02-21 | Payer: No Typology Code available for payment source | Source: Ambulatory Visit

## 2023-02-22 ENCOUNTER — Ambulatory Visit
Admission: RE | Admit: 2023-02-22 | Discharge: 2023-02-22 | Disposition: A | Discharge status: Home / Self Care | Payer: 59 | Source: Ambulatory Visit

## 2023-02-22 DIAGNOSIS — R221 Localized swelling, mass and lump, neck: Secondary | ICD-10-CM

## 2023-02-22 MED ORDER — IOPAMIDOL (ISOVUE-300) INJECTION 61%
75.0000 mL | Freq: Once | INTRAVENOUS | Status: AC | PRN
  Administered 2023-02-22: 75 mL via INTRAVENOUS

## 2023-03-05 ENCOUNTER — Other Ambulatory Visit (HOSPITAL_COMMUNITY): Payer: Self-pay | Admitting: Otolaryngology

## 2023-03-05 DIAGNOSIS — R599 Enlarged lymph nodes, unspecified: Secondary | ICD-10-CM

## 2023-03-07 NOTE — Progress Notes (Signed)
Richarda Overlie, MD  Leodis Rains D PROCEDURE / BIOPSY REVIEW Date: 03/06/23  Requested Biopsy site: Left neck lymph node Reason for request: Needs diagnosis Imaging review: Best seen on CT neck 02/22/23, image 63/2.  Decision: Approved Imaging modality to perform: Ultrasound Schedule with: Patient preference (Local vs Mod Sed) Schedule for: Any VIR  Additional comments:   Please contact me with questions, concerns, or if issue pertaining to this request arise.  Arn Medal, MD [facility]

## 2023-03-26 ENCOUNTER — Telehealth: Payer: Self-pay

## 2023-03-26 NOTE — Telephone Encounter (Signed)
Called patient to confirm her upcoming appointment. Was unable to leave a voicemail. Sent patient a MyChart message to confirm.

## 2023-04-01 ENCOUNTER — Encounter: Payer: No Typology Code available for payment source | Admitting: Obstetrics and Gynecology

## 2023-04-08 ENCOUNTER — Encounter (HOSPITAL_COMMUNITY): Payer: Self-pay

## 2023-04-08 ENCOUNTER — Ambulatory Visit (HOSPITAL_COMMUNITY): Admission: RE | Admit: 2023-04-08 | Payer: 59 | Source: Ambulatory Visit
